# Patient Record
Sex: Female | Born: 1980 | Hispanic: Yes | Marital: Married | Smoking: Never smoker
Health system: Southern US, Community
[De-identification: ages and names within clinical notes are randomized; demographics above are authoritative.]

## PROBLEM LIST (undated history)

## (undated) DIAGNOSIS — E119 Type 2 diabetes mellitus without complications: Secondary | ICD-10-CM

---

## 2002-06-19 ENCOUNTER — Other Ambulatory Visit: Admission: RE | Admit: 2002-06-19 | Discharge: 2002-06-19 | Payer: Self-pay | Admitting: Obstetrics and Gynecology

## 2002-12-09 ENCOUNTER — Inpatient Hospital Stay (HOSPITAL_COMMUNITY): Admission: AD | Admit: 2002-12-09 | Discharge: 2002-12-15 | Payer: Self-pay | Admitting: Obstetrics and Gynecology

## 2002-12-10 ENCOUNTER — Encounter (INDEPENDENT_AMBULATORY_CARE_PROVIDER_SITE_OTHER): Payer: Self-pay | Admitting: Specialist

## 2003-01-09 ENCOUNTER — Other Ambulatory Visit: Admission: RE | Admit: 2003-01-09 | Discharge: 2003-01-09 | Payer: Self-pay | Admitting: Obstetrics and Gynecology

## 2004-02-07 ENCOUNTER — Other Ambulatory Visit: Admission: RE | Admit: 2004-02-07 | Discharge: 2004-02-07 | Payer: Self-pay | Admitting: Obstetrics and Gynecology

## 2004-08-23 ENCOUNTER — Inpatient Hospital Stay (HOSPITAL_COMMUNITY): Admission: AD | Admit: 2004-08-23 | Discharge: 2004-08-23 | Payer: Self-pay | Admitting: *Deleted

## 2008-01-24 ENCOUNTER — Ambulatory Visit: Payer: Self-pay | Admitting: Family Medicine

## 2008-01-24 ENCOUNTER — Encounter: Payer: Self-pay | Admitting: Family Medicine

## 2008-01-24 ENCOUNTER — Inpatient Hospital Stay (HOSPITAL_COMMUNITY): Admission: AD | Admit: 2008-01-24 | Discharge: 2008-01-26 | Payer: Self-pay | Admitting: Obstetrics

## 2008-08-29 ENCOUNTER — Inpatient Hospital Stay (HOSPITAL_COMMUNITY): Admission: RE | Admit: 2008-08-29 | Discharge: 2008-08-29 | Payer: Self-pay | Admitting: Obstetrics and Gynecology

## 2010-11-11 NOTE — Op Note (Signed)
NAME:  Catherine Villa, Catherine Villa                ACCOUNT NO.:  [id number]   MEDICAL RECORD NO.:  [id number]          PATIENT TYPE:  INP   LOCATION:  9106                          FACILITY:  [facility]   PHYSICIAN:  Tanya S. Shawnie Pons, M.D.   DATE OF BIRTH:  [birth year]   DATE OF PROCEDURE:  01/24/2008  DATE OF DISCHARGE:                               OPERATIVE REPORT   PREOPERATIVE DIAGNOSES:  1. Intrauterine pregnancy at 39-6/7th weeks.  2. Premature rupture of membranes.  3. Previous cesarean section.   POSTOPERATIVE DIAGNOSES:  1. Intrauterine pregnancy at 39-6/7th weeks.  2. Premature rupture of membranes.  3. Previous cesarean section.   PROCEDURE:  Repeat low transverse cesarean section.   SURGEON:  Shelbie Proctor. Shawnie Pons, MD   ASSISTANT:  Karlton Lemon, MD   ANESTHESIA:  Spinal and local with Dr. Arby Barrette.   FINDINGS:  Viable female infant, Apgars 9 and 9, and weight 8 pounds 5  ounces.   SPECIMEN:  Placenta to pathology.   ESTIMATED BLOOD LOSS:  1500 mL.   COMPLICATIONS:  None immediately known.   REASON FOR PROCEDURE:  Briefly, the patient is a 30 year old gravida 2,  para 1 who presents at 39-6/7th weeks with premature rupture of  membranes at [facility] this morning.  The patient has a previous C-  section and desires a repeat.   PROCEDURE:  The patient was taken to the OR.  She was placed in the  supine position on left lateral tilt.  She was prepped and draped in the  usual sterile fashion.  A Foley catheter was placed at the bladder.  The  knife was used to make a Pfannenstiel incision through an old scar.  This was carried down to the underlying fascia.  The patient reported  pain that was 7/10, which was very incongruent with the look on her  face.  However, the procedure was held.  Marcaine 30 mL of 0.25% were  injected in the incision and laterally in the fascia where the nerve  comes through, however, the patient continued to complain of pain.  After extensive discussion with  her and giving her nitrous, she reports  she was not feeling any pain, chest pressure, and the patient was not  tachycardic or exhibiting other signs of exquisite pain as would be  thought that she would feel with a skin incision of the caliber that was  made.  Once the patient was comfortable, the surgery continued and the  fascial incision was extended laterally with Mayo scissors.  Curved  clamps were then used to elevate the fascia off the underlying rectus  which was dissected off bluntly laterally and sharply in the midline.  The posterior sheath and pyramidalis muscles were divided in the midline  superiorly and the peritoneal cavity entered bluntly.  Once peritoneal  cavity was entered, an Teacher, early years/pre was placed inside the incision.  The patient had some pain with strain on the peritoneal cavity as well.  A knife was then used to make a low transverse incision on the uterus.  This was carried down to the amniotic cavity  which was entered bluntly.  Clear fluid was noted.  The uterine incision was extended with the  bandage scissors.  The infant was in a vertex position.  It was brought  up and delivered without difficulty.  Cord was clamped x2.  Spontaneous  crying was heard.  Infant was bulb suctioned on the abdomen and given to  awaiting peds.  Cord blood was obtained.  Placenta was delivered from  the uterus without difficulty.  Edges of the uterine incision were then  grasped with ring forceps.  The uterine incision closed with 0 Vicryl  suture in a locked running fashion.  There was some uterine atony noted  as well as some large blood vessels at the edges of the incision that  were bleeding fairly heavily.  Following initial first layer of closure,  there was some bleeding noted at both corners of the incision and in the  midline and several figure-of-eights were used with 0 Vicryl suture to  achieve hemostasis.  After hemostasis was achieved, Alexis retractor was   removed from the abdomen and the peritoneum and bowel were placed behind  the uterus.  The incision was looked out again and was felt to be  hemostatic.  The fascia was closed with 0 Vicryl suture in a running  fashion.  Subcutaneous tissue was irrigated.  Any bleeders were  cauterized.  Skin closed using clips.  Pressure dressing was applied.  All instrument, needle, and lap counts were correct x2.  The patient was  awake and taken to recovery room in stable condition.      Shelbie Proctor. Shawnie Pons, M.D.  Electronically Signed     TSP/MEDQ  D:  01/24/2008  T:  01/25/2008  Job:  [id number]

## 2010-11-11 NOTE — Discharge Summary (Signed)
NAME:  Catherine Villa, Catherine Villa                ACCOUNT NO.:  [id number]   MEDICAL RECORD NO.:  [id number]          PATIENT TYPE:  INP   LOCATION:  9106                          FACILITY:  [facility]   PHYSICIAN:  Tanya S. Shawnie Pons, M.D.   DATE OF BIRTH:  02-25-1981   DATE OF ADMISSION:  01/24/2008  DATE OF DISCHARGE:  01/26/2008                               DISCHARGE SUMMARY   DISCHARGE DIAGNOSES:  1. Status post repeat low-transverse cesarean section.  2. Intrauterine pregnancy at 39.6 weeks.   PROCEDURE:  Repeat cesarean section.   HOSPITAL COURSE:  The patient is a 30 year old G2, P1-0-0-1 at 24. 6  weeks who presented with premature rupture of membranes at scheduled  prenatal visit at [facility].  The patient was directly admitted  and desired repeat C-section.  The patient tolerated the surgery well  and delivered a viable female with Apgars of 9 at one minute and 9 at  five minutes, 8 pounds and 5 ounces.  Estimated blood loss was 1500 ml.  Three-vessel cord placenta.  Postoperatively, the patient had a  hemoglobin of 7.6 and 23.2 hematocrit.  The patient remained  asymptomatic.  Orthostatic blood pressures were within normal range.  The patient was breast feeding and decided on Micronor with bridge to  Mirena IUD at 6 weeks postpartum visit. Patient was stable upon  discharge.   ADMISSION LABS:  O positive.  HIV negative.  RPR nonreactive.  Rubella  immune.  GC and Chlamydia negative.  GBS negative.  Hemoglobin and  hematocrit on January 24, 2008, 11.4 and 35.0.  Hemoglobin and hematocrit on January 26, 2008, at discharge was 7.9 and  23.9 respectively.   DISCHARGE MEDICATIONS:  1. Percocet 5/325 mg one tab p.o. q.4-6 h. p.r.n. pain.  2. Ibuprofen 600 mg one tab p.o. q.6 h. p.r.n. pain.  3. Colace 100 mg p.o. b.i.d. p.r.n. constipation.  4. Iron sulfate 325 mg one tab p.o. b.i.d.  5. Prenatal vitamins daily.  6. Micronor 1 tab p.o. daily.   The patient is to have pelvic rest x6 weeks.  No  heavy lifting.  Return  for evaluation if heavy bleeding, headache, or dizziness occurs.   FOLLOWUP:  The patient is to follow up at the [facility] in 6 weeks.  Baby love to discontinue staples on  postop day 5 or 6.      Milinda Antis, MD  Electronically Signed     ______________________________  Shelbie Proctor. Shawnie Pons, M.D.    KD/MEDQ  D:  01/26/2008  T:  01/27/2008  Job:  [id number]

## 2010-11-14 NOTE — Discharge Summary (Signed)
NAMEMARIABELEN, PRESSLY                   ACCOUNT NO.:  [id number]   MEDICAL RECORD NO.:  [id number]                   PATIENT TYPE:  INP   LOCATION:  9144                                 FACILITY:  [facility]   PHYSICIAN:  Lorne Skeens, D.O.                   DATE OF BIRTH:  [birth year]   DATE OF ADMISSION:  12/09/2002  DATE OF DISCHARGE:  12/15/2002                                 DISCHARGE SUMMARY   DISCHARGE DIAGNOSES:  1. Postoperative day number five status post low-transverse cesarean section     for cephalopelvic disproportion at 39 weeks and 5 days.  2. Pre-eclampsia.  3. Hypertension.   DISCHARGE MEDICATIONS:  1. Percocet 5/325 one tablet p.o. q.4h. p.r.n. pain.  2. Ibuprofen 600 mg one tablet p.o. q.6h. p.r.n. cramping.  3. Ortho-Tri-Cyclen one tablet p.o. daily to start on December 24, 2002.  4. Prenatal vitamins one tablet daily x 6 weeks or while breast feeding.  5. HCTZ 25 mg one tablet p.o. daily.   HOSPITAL COURSE:  A 30 year old now G1, P1-0-0-1 presented at 39 weeks and 4  days to the maternal admissions for spontaneous rupture of membranes.  She  was unaware of her contractions occurring every 2-3 minutes.  On digital  cervical exam she was fingertip, external os closed, internal os 2 cm long,  -2 station and vertex position.  She was confirmed spontaneous rupture of  membranes with positive ferning and positive nitrazine test.  Her OB panel  was unremarkable with O positive blood type, antibody negative, rubella  immune, GBS negative, RPR nonreactive.  Blood pressures were mildly elevated  up to 142/94.   PHYSICAL EXAMINATION:  GENERAL:  She was in no acute distress.  HEART:  Unremarkable.  LUNGS:  Unremarkable.  ABDOMEN:  Soft, nontender.  Gravid.  No fundal tenderness.  EXTREMITIES:  She had trace pedal edema.  DTRs were 2-3+ with no clonus.   The patient was admitted for spontaneous rupture of membranes and rule out  PIH.  PIH labs were performed and a cath  UA was done for protein.  The  patient was encouraged to labor, given Pitocin and started to have a low-  grade temperature while in labor.  She was then started on Unison 3 grams IV  q.6h. for prolonged rupture of membranes.  After pushing for more than two  hours with increased molding.  She was still febrile and she was brought to  C-section for cephalopelvic disproportion.  She gave birth to an 8 pound 4  ounce term female with Apgar's 8 and 9 and a cord pH of 7.31.  C-section  performed by Dr. Okey Dupre.  Estimated blood loss 700 cc. She received a total of  two days of Unison postpartum.  While on magnesium sulfate she remained in  the adult intensive care unit.  She diuresed well but still continued to  have some edema.  Her blood pressures did improve  but she needed the help of  HCTZ increased to 25 mg p.o. daily.  She denied any symptoms of headache,  visual changes, epigastric or right upper quadrant pain.  Her postpartum  hemoglobin was 8.4 and she will be started on iron sulfate 325 mg b.i.d. x 6  weeks.  She did receive mag sulfate until 1700 on June 16.  Her PIH labs  trended down.  On discharge day she has 2+ nonpitting edema, no clonus, no  epigastric or right upper quadrant pain and is currently stable.  She is  currently  bottle feeding.  She will be started on Ortho-Tri-Cyclen the second Sunday  after delivery.  Staples have been removed post-op day #5.  She will be sent  home with pre-eclampsia instructions and followup appointment in six weeks  at [facility].                                                Lorne Skeens, D.O.    KL/MEDQ  D:  12/15/2002  T:  12/16/2002  Job:  [id number]

## 2010-11-14 NOTE — Op Note (Signed)
NAMESHONICA, Catherine Villa                   ACCOUNT NO.:  [id number]   MEDICAL RECORD NO.:  [id number]                   PATIENT TYPE:  INP   LOCATION:  9160                                 FACILITY:  [facility]   PHYSICIAN:  Phil D. Okey Dupre, M.D.                  DATE OF BIRTH:  30-Sep-1980   DATE OF PROCEDURE:  12/10/2002  DATE OF DISCHARGE:                                 OPERATIVE REPORT   PREOPERATIVE DIAGNOSIS:  Cephalopelvic disproportion.   POSTOPERATIVE DIAGNOSIS:  Cephalopelvic disproportion.   PROCEDURE:  Low transverse cesarean section.   SURGEON:  Javier Glazier. Okey Dupre, M.D.   FIRST ASSISTANT:  Tamika J. Lazarus Salines, M.D.   ANESTHESIA:  Epidural.   ESTIMATED BLOOD LOSS:  700 mL.   OPERATIVE FINDINGS:  On entering the uterus, the vertex was in the direct  posterior presentation with 4+ caput and molding.   PROCEDURE:  Under satisfactory epidural anesthesia with the patient in the  dorsal supine position and a Foley catheter in the urinary bladder, the  abdomen was prepped and draped in the usual sterile manner and entered  through a transverse suprapubic incision situated 3 cm above the symphysis  pubis and extended for a total length of 18 cm.  The abdomen was entered by  layers.  On entering the peritoneal cavity, the visceral peritoneum of the  anterior surface of the uterus opened transversely by sharp dissection, the  bladder pushed away from the lower uterine segment, which was entered by  sharp and blunt dissection  and from a direct posterior presentation, the  vertex was easily delivered, the cord doubly clamped and divided, and the  baby handed to the pediatrician.  Cord blood taken for analysis.  The  placenta was manually removed and cultured for aerobic and anaerobic  bacteria.  The uterus was explored and closed with continuous running locked  0 Vicryl on an atraumatic needle.  The area was observed for bleeding and  none was noted, and the fascia was closed from either  end of the incision  meeting in the midpoint in continuous alternating locked 0 Vicryl suture on  an atraumatic needle.  Subcutaneous bleeders were controlled with hot  cautery.  The skin was approximated with skin staples.  A dry sterile  dressing was applied.  The patient was transferred to the recovery room in  satisfactory condition.  During the entire procedure and prior to the  procedure, no urine was noted in the catheter, so the patient will be given  a bolus in the recovery room.  The instrument, sponge, and needle count were  reported as correct at the end of the procedure.                                               Phil D. Okey Dupre,  M.D.    PDR/MEDQ  D:  12/10/2002  T:  12/11/2002  Job:  [id number]

## 2011-01-05 ENCOUNTER — Telehealth (HOSPITAL_COMMUNITY): Payer: Self-pay | Admitting: Lactation Services

## 2011-01-05 NOTE — Telephone Encounter (Signed)
Pt is selected in error.

## 2011-03-27 LAB — CBC
HCT: 23.9 — ABNORMAL LOW
HCT: 35 — ABNORMAL LOW
Hemoglobin: 11.4 — ABNORMAL LOW
Hemoglobin: 7.9 — CL
MCHC: 32.8
MCHC: 33.2
MCV: 78.4
MCV: 78.6
Platelets: 204
RBC: 2.96 — ABNORMAL LOW
RBC: 3.05 — ABNORMAL LOW
RBC: 4.44
RDW: 15.8 — ABNORMAL HIGH
WBC: 14 — ABNORMAL HIGH

## 2014-02-08 LAB — OB RESULTS CONSOLE ABO/RH: RH TYPE: POSITIVE

## 2014-02-08 LAB — OB RESULTS CONSOLE GC/CHLAMYDIA
CHLAMYDIA, DNA PROBE: NEGATIVE
Gonorrhea: NEGATIVE

## 2014-02-08 LAB — OB RESULTS CONSOLE PLATELET COUNT: PLATELETS: 280 10*3/uL

## 2014-02-08 LAB — GLUCOSE TOLERANCE, 1 HOUR: Glucose, 1 Hour GTT: 161

## 2014-02-08 LAB — OB RESULTS CONSOLE HGB/HCT, BLOOD
HCT: 40 %
HEMOGLOBIN: 12.8 g/dL

## 2014-02-08 LAB — CULTURE, OB URINE: Urine Culture, OB: NEGATIVE

## 2014-02-08 LAB — OB RESULTS CONSOLE RPR: RPR: NONREACTIVE

## 2014-02-08 LAB — CYTOLOGY - PAP: PAP SMEAR: NEGATIVE

## 2014-02-08 LAB — OB RESULTS CONSOLE VARICELLA ZOSTER ANTIBODY, IGG: Varicella: IMMUNE

## 2014-02-08 LAB — OB RESULTS CONSOLE ANTIBODY SCREEN: Antibody Screen: NEGATIVE

## 2014-02-09 LAB — SICKLE CELL SCREEN: Sickle Cell Screen: NORMAL

## 2014-02-09 LAB — OB RESULTS CONSOLE HIV ANTIBODY (ROUTINE TESTING): HIV: NONREACTIVE

## 2014-02-09 LAB — OB RESULTS CONSOLE HEPATITIS B SURFACE ANTIGEN: Hepatitis B Surface Ag: NEGATIVE

## 2014-02-09 LAB — OB RESULTS CONSOLE RUBELLA ANTIBODY, IGM: RUBELLA: IMMUNE

## 2014-02-12 LAB — GLUCOSE TOLERANCE, 3 HOURS
Glucose, GTT - 1 Hour: 110 mg/dL (ref ?–200)
Glucose, GTT - 2 Hour: 167 mg/dL — AB (ref ?–140)
Glucose, GTT - 3 Hour: 146 mg/dL — AB (ref ?–140)
Glucose, GTT - Fasting: 95 mg/dL (ref 80–110)

## 2014-02-22 ENCOUNTER — Encounter: Payer: Self-pay | Admitting: General Practice

## 2014-02-26 ENCOUNTER — Encounter: Payer: Self-pay | Attending: Obstetrics & Gynecology | Admitting: *Deleted

## 2014-02-26 ENCOUNTER — Encounter: Payer: Self-pay | Admitting: Obstetrics & Gynecology

## 2014-02-26 ENCOUNTER — Ambulatory Visit (INDEPENDENT_AMBULATORY_CARE_PROVIDER_SITE_OTHER): Payer: Self-pay | Admitting: Obstetrics & Gynecology

## 2014-02-26 VITALS — BP 100/55 | HR 68 | Temp 97.9°F | Wt 129.5 lb

## 2014-02-26 DIAGNOSIS — O24919 Unspecified diabetes mellitus in pregnancy, unspecified trimester: Secondary | ICD-10-CM

## 2014-02-26 DIAGNOSIS — O24319 Unspecified pre-existing diabetes mellitus in pregnancy, unspecified trimester: Secondary | ICD-10-CM | POA: Insufficient documentation

## 2014-02-26 DIAGNOSIS — E119 Type 2 diabetes mellitus without complications: Secondary | ICD-10-CM | POA: Insufficient documentation

## 2014-02-26 DIAGNOSIS — Z713 Dietary counseling and surveillance: Secondary | ICD-10-CM | POA: Insufficient documentation

## 2014-02-26 DIAGNOSIS — O24312 Unspecified pre-existing diabetes mellitus in pregnancy, second trimester: Secondary | ICD-10-CM

## 2014-02-26 DIAGNOSIS — O34219 Maternal care for unspecified type scar from previous cesarean delivery: Secondary | ICD-10-CM | POA: Insufficient documentation

## 2014-02-26 LAB — POCT URINALYSIS DIP (DEVICE)
Bilirubin Urine: NEGATIVE
Glucose, UA: NEGATIVE mg/dL
Ketones, ur: NEGATIVE mg/dL
Leukocytes, UA: NEGATIVE
Nitrite: NEGATIVE
Protein, ur: NEGATIVE mg/dL
Specific Gravity, Urine: 1.015 (ref 1.005–1.030)
Urobilinogen, UA: 1 mg/dL (ref 0.0–1.0)
pH: 7.5 (ref 5.0–8.0)

## 2014-02-26 LAB — COMPREHENSIVE METABOLIC PANEL
ALBUMIN: 4 g/dL (ref 3.5–5.2)
AST: 12 U/L (ref 0–37)
Alkaline Phosphatase: 62 U/L (ref 39–117)
BUN: 7 mg/dL (ref 6–23)
CALCIUM: 9.1 mg/dL (ref 8.4–10.5)
CO2: 22 meq/L (ref 19–32)
Chloride: 104 mEq/L (ref 96–112)
Creat: 0.47 mg/dL — ABNORMAL LOW (ref 0.50–1.10)
Glucose, Bld: 79 mg/dL (ref 70–99)
POTASSIUM: 3.8 meq/L (ref 3.5–5.3)
Sodium: 136 mEq/L (ref 135–145)
Total Bilirubin: 0.5 mg/dL (ref 0.2–1.2)
Total Protein: 6.9 g/dL (ref 6.0–8.3)

## 2014-02-26 LAB — TSH: TSH: 0.514 u[IU]/mL (ref 0.350–4.500)

## 2014-02-26 LAB — CBC
HCT: 35 % — ABNORMAL LOW (ref 36.0–46.0)
Hemoglobin: 12.2 g/dL (ref 12.0–15.0)
MCH: 27.9 pg (ref 26.0–34.0)
MCHC: 34.9 g/dL (ref 30.0–36.0)
MCV: 80.1 fL (ref 78.0–100.0)
PLATELETS: 312 10*3/uL (ref 150–400)
RBC: 4.37 MIL/uL (ref 3.87–5.11)
RDW: 14.4 % (ref 11.5–15.5)
WBC: 11.1 10*3/uL — ABNORMAL HIGH (ref 4.0–10.5)

## 2014-02-26 MED ORDER — ASPIRIN EC 81 MG PO TBEC: 81.0000 mg | DELAYED_RELEASE_TABLET | Freq: Every day | ORAL | Status: DC

## 2014-02-26 NOTE — Progress Notes (Signed)
Nutrition note: 1st visit consult & GDM diet education Pt is a newly diagnosed GDM pt. Pt has gained 9.5# @ [redacted]w[redacted]d, which is > expected. Pt reports eating 3 meals & 3-4 snacks/d. Pt is taking a PNV. Pt reports no N/V or heartburn. Pt reports walking occ. Pt received verbal & written education on GDM diet in Spanish via interpreter. Discussed importance of BF. Discussed wt gain goals of 25-35# or 1#/wk. Pt agrees to follow GDM diet with 3 meals & 3 snacks/d with proper CHO/ protein combination.  Pt has WIC & plans to BF. F/u in 4-6 wks Blondell Reveal, MS, RD, LDN, [facility]

## 2014-02-26 NOTE — Progress Notes (Signed)
Patient is Spanish-speaking only, Spanish interpreter present for this encounter.  Baseline labs  Baby ASA prescribed  Fetal ECHO - scheduled  Optho exam - scheduled  Serial growth scans [id number]  Antenatal testing starting at 32 weeks  Delivery by 39 weeks or earlier if needed Anatomy scan ordered. Quad screen ordered. Discussed implications of DM in pregnancy, need for optimizing glycemic control to decrease DM associated maternal-fetal morbidity and mortality, need for antenatal testing and frequent ultrasounds/prenatal visits. Will get DM education and supplies today.  Reevaluate in two weeks. Routine obstetric precautions reviewed.

## 2014-02-26 NOTE — Patient Instructions (Signed)
Diabetes mellitus tipo 1 o tipo 2 durante el embarazo (Type 1 or Type 2 Diabetes Mellitus During Pregnancy) La diabetes mellitus, generalmente denominada diabetes, es una enfermedad prolongada (crnica). La diabetes tipo1 ocurre cuando las clulas de los islotes, que estn en el pncreas y producen la hormona insulina, se destruyen y ya no pueden producir insulina. La diabetes tipo2 ocurre cuando el pncreas no produce suficiente insulina, las clulas son menos sensibles a la insulina que se produce (resistencia a la insulina), o ambos. La insulina es necesaria para movilizar los azcares de los alimentos a las clulas de los tejidos. Las clulas de los tejidos utilizan los azcares para obtener energa. La falta de insulina o la falta de una respuesta normal a la insulina hace que el exceso de azcar se acumule en la sangre en lugar de penetrar en las clulas de los tejidos. Como resultado, se producen niveles altos de azcar en la sangre (hiperglucemia).  Si se mantienen los niveles de glucosa en la sangre en un rango normal antes y durante el embarazo, las mujeres pueden tener un embarazo saludable. Si no se controlan los niveles de glucosa en la sangre adecuadamente, puede haber riesgos para usted, para el beb que no ha nacido y durante el trabajo de parto y el parto. Tambin puede haber riesgos para el beb cuando nazca.  FACTORES DE RIESGO   Una persona est predispuesta a desarrollar diabetes tipo 1 si alguien en su familia padece la enfermedad y se expone a ciertos desencadenantes ambientales adicionales.  Tiene una mayor probabilidad de desarrollar diabetes tipo 2 si tiene antecedentes familiares de diabetes y tambin tiene uno o ms de los siguientes factores de riesgo:  Tener sobrepeso.  Tiene un estilo de vida sedentario.  Ha consumido, a lo largo de toda su vida, una gran cantidad de alimentos con muchas caloras. SNTOMAS  Aumento de la sed (polidipsia).  Aumento de la miccin  (poliuria).  Orina con ms frecuencia durante la noche (nocturia).  Prdida de peso. La prdida de peso puede ser muy rpida.  Infecciones frecuentes y recurrentes.  Cansancio (fatiga).  Debilidad.  Cambios en la visin, como visin borrosa.  Olor a fruta en el aliento.  Dolor abdominal.  Nuseas o vmitos. DIAGNSTICO  La diabetes se diagnostica cuando hay aumento de los niveles de glucosa en la sangre. El nivel de glucosa en la sangre puede controlarse en uno o ms de los siguientes anlisis de sangre:  Medicin de glucosa en la sangre en ayunas. No se le permitir comer durante al menos 8 horas antes de que se tome una muestra de sangre.  Pruebas al azar de glucosa en la sangre. El nivel de glucosa en la sangre se controla en cualquier momento del da sin importar el momento en que haya comido.  Prueba de A1c (hemoglobina glucosilada) Una prueba de A1c proporciona informacin sobre el control de la glucosa en la sangre durante los ltimos 3 meses.  Prueba de tolerancia a la glucosa oral (PTGO). La glucosa en la sangre se mide despus de no haber comido (ayunas) durante una a tres horas y despus de beber una bebida que contenga glucosa. La prueba de tolerancia a la glucosa oral se realiza durante las semanas 24 a 28 del embarazo. TRATAMIENTO   Usted tendr que tomar medicamentos para la diabetes o insulina diariamente para mantener los niveles de glucosa en la sangre en el rango deseado.  Usted tendr que combinar la dosis de insulina con la actividad fsica y   la eleccin de alimentos saludables. El objetivo del tratamiento es mantener el nivel de azcar en la sangre previo a comer (preprandial) y durante la noche entre 60 y 99mg/dl, durante todo el embarazo. El objetivo del tratamiento es mantener el mayor nivel de azcar en la sangre despus de comer (glucosa pospandial) entre 100 y 140mg/dl.  INSTRUCCIONES PARA EL CUIDADO EN EL HOGAR   Controle su nivel de hemoglobina A1c  dos veces al ao.  Contrlese a diario el nivel de glucosa en la sangre segn las indicaciones de su mdico. Es comn realizar controles frecuentes de la glucosa en la sangre.  Supervise las cetonas en la orina cuando est enfermo y segn las indicaciones de su mdico.  Tome el medicamento para la diabetes y adminstrese insulina segn las indicaciones de su mdico para mantener el nivel de glucosa en la sangre en el rango deseado.  Nunca se quede sin medicamento para la diabetes o sin insulina. Es necesario que la reciba todos los das.  Ajuste la insulina segn la ingesta de hidratos de carbono. Los hidratos de carbono pueden aumentar los niveles de glucosa en la sangre, pero deben incluirse en su dieta. Aportan vitaminas, minerales y fibra que son una parte esencial de una dieta saludable. Los hidratos de carbono se encuentran en frutas, verduras, cereales integrales, productos lcteos, legumbres y alimentos que contienen azcares aadidos.  Consuma alimentos saludables. Alterne 3 comidas con 3 colaciones.  Aumente de peso saludablemente. El aumento del peso total vara de acuerdo con el ndice de masa corporal que tena antes del embarazo ([facility]).  Lleve una tarjeta de alerta mdica o use un brazalete o medalla de alerta mdica.  Lleve con usted una colacin de 15gramos de carbohidratos en todo momento para controlar los niveles bajos de glucosa en la sangre (hipoglucemia). Algunos ejemplos de colaciones de 15gramos de hidratos de carbono son los siguientes:  Tabletas de glucosa, 3 o 4.  Gel de glucosa, tubo de 15 gramos.  Pasas de uva, 2cucharadas (24gramos).  Caramelos de goma, 6.  Galletas de animales, 8.  Jugo de fruta, gaseosa comn, o leche descremada, 4 onzas (120 ml).  Pastillas de goma, 9.  Reconocer la hipoglucemia. Durante el embarazo la hipoglucemia se produce cuando hay niveles de glucosa en la sangre de 60 mg/dl o menos. El riesgo de hipoglucemia aumenta durante  el ayuno o cuando se saltea las comidas, durante o despus de realizar ejercicio intenso y mientras duerme. Los sntomas de hipoglucemia son:  Temblores o sacudidas.  Disminucin de la capacidad de concentracin.  Sudoracin.  Aumento de la frecuencia cardaca.  Dolor de cabeza.  Sequedad en la boca.  Hambre.  Irritabilidad.  Ansiedad.  Sueo agitado.  Alteracin del habla o de la coordinacin.  Confusin.  Tratar la hipoglucemia rpidamente. Si usted est alerta y puede tragar con seguridad, siga la regla de 15/15 que consiste en:  Tome entre 15 y 20gramos de glucosa de accin rpida o carbohidratos. Las opciones de accin rpida son un gel de glucosa, tabletas de glucosa, o 4 onzas (120 ml) de jugo de frutas, gaseosa comn, o leche baja en grasa.  Compruebe su nivel de glucosa en la sangre 15 minutos despus de tomar la glucosa.  Tome entre 15 y 20gramos ms de glucosa si el nivel de glucosa en la sangre todava es de 70mg/dl o inferior.  Ingiera una comida o una colacin en el lapso de 1 hora una vez que los niveles de glucosa en la sangre   vuelven a la normalidad.  Haga actividad fsica por lo menos 30minutos al da o como lo indique su mdico. Se recomienda que 30 minutos despus de cada comida, realice diez minutos de actividad fsica para controlar los niveles de glucosa postprandial en la sangre.  Est alerta a la poliuria (miccin excesiva) y la polidipsia (sensacin de mucha sed), que son los primeros signos de la hiperglucemia. El reconocimiento temprano de la hiperglucemia permite un tratamiento oportuno. Trate la hiperglucemia segn le indic su mdico.  Ajuste su dosis de insulina y la ingesta de alimentos, segn sea necesario, si inicia un nuevo ejercicio o deporte.  Siga su plan para los das de enfermedad cuando no puede comer o beber como de costumbre.  Evite el tabaco y el alcohol.  Concurra a todas las visitas de control como se lo haya indicado el  mdico.  Siga el consejo del mdico respecto a los controles prenatales y posteriores al parto (postparto), las visitas, la planificacin de las comidas, el ejercicio, los medicamentos, las vitaminas, los anlisis de sangre, otras pruebas mdicas y actividades fsicas.  Cuide diariamente la piel y los pies. Examine su piel y los pies diariamente para ver si tiene cortes, moretones, enrojecimiento, problemas en las uas, sangrado, ampollas o llagas. Su mdico debe hacerle un examen de los pies una vez por ao.  Cepllese los dientes y encas por lo menos dos veces al da y use hilo dental al menos una vez por da. Concurra regularmente a las visitas de control con el dentista.  Programe un examen de vista durante el primer trimestre de su embarazo o como lo indique su mdico.  Comparta su plan de control de diabetes en el trabajo o en la escuela.  Mantngase al da con las vacunas.  Aprenda a manejar el estrs.  Obtenga la mayor cantidad posible de informacin sobre la diabetes y solicite ayuda siempre que sea necesario.  Su mdico puede recomendarle que tome una aspirina de dosis baja (81mg) cada da a fin de ayudar a prevenir la hipertensin durante el embarazo (preeclampsia o eclampsia). Puede estar en riesgo de padecer preeclampsia o eclampsia si:  Padeci preeclampsia o eclampsia durante un embarazo anterior.  Su beb no creci segn lo previsto durante un embarazo anterior.  Tuvo un parto prematuro en un embarazo anterior.  Experiment una separacin de la placenta desde el tero (desprendimiento abrupto de la placenta) durante un embarazo anterior.  Perdi un beb en un embarazo anterior.  Est embarazada de ms de un beb.  Padece otras afecciones mdicas, como hipertensin arterial o una enfermedad autoinmunitaria. SOLICITE ATENCIN MDICA SI:   No puede comer alimentos o beber por ms de 6 horas.  Tuvo nuseas o ha vomitado durante ms de 6 horas.  Tiene un nivel de  glucosa en la sangre de 200 mg/dl y cetonas en la orina.  Presenta algn cambio en el estado mental.  Desarrolla problemas de visin.  Sufre un dolor persistente de cabeza.  Siente dolor o molestias en la parte superior del abdomen.  Tiene una enfermedad grave adicional.  Tuvo diarrea durante ms de 6 horas.  Ha estado enfermo o ha tenido fiebre durante 2 das y no mejora. SOLICITE ATENCIN MDICA DE INMEDIATO SI:  Tiene dificultad para respirar.  Ya no siente los movimientos del beb.  Est sangrando o tiene flujo vaginal.  Comienza a tener contracciones o trabajo de parto prematuro. ASEGRESE DE QUE:  Comprende estas instrucciones.  Controlar su afeccin.  Recibir ayuda de   inmediato si no mejora o si empeora. Document Released: 03/09/2012 Document Revised: 10/30/2013 [employer] Patient Information 2015 [employer]. This information is not intended to replace advice given to you by your health care provider. Make sure you discuss any questions you have with your health care provider.  

## 2014-02-26 NOTE — Progress Notes (Signed)
  Patient was seen on 02/26/14 for Gestational Diabetes self-management . Her primary language is spanish, interpreter present. The following learning objectives were met by the patient :   States the definition of Gestational Diabetes  States why dietary management is important in controlling blood glucose  States when to check blood glucose levels  Demonstrates proper blood glucose monitoring techniques  States the effect of stress and exercise on blood glucose levels  Plan:  Consider  increasing your activity level by walking daily as tolerated Begin checking BG before breakfast and 2 hours after first bit of breakfast, lunch and dinner after  as directed by MD   Blood glucose monitor given: [employer] Lot # [id number] Exp: 2016/03/18 Blood glucose reading: 95  Patient instructed to monitor glucose levels: FBS: 60 - <90 2 hour: <120  Patient received the following handouts:  Nutrition Diabetes and Pregnancy  Patient will be seen for follow-up as needed.

## 2014-02-27 LAB — PROTEIN / CREATININE RATIO, URINE
CREATININE, URINE: 186.4 mg/dL
Protein Creatinine Ratio: 0.08 (ref <0.15)
TOTAL PROTEIN, URINE: 14 mg/dL (ref 5–24)

## 2014-02-27 LAB — HEMOGLOBIN A1C
HEMOGLOBIN A1C: 5.6 % (ref <5.7)
Mean Plasma Glucose: 114 mg/dL (ref <117)

## 2014-02-28 LAB — AFP, QUAD SCREEN
AFP: 6.1 ng/mL
HCG, Total: 102.71 IU/mL
INH: 509.4 pg/mL
uE3 Value: 0.17 ng/mL

## 2014-02-28 LAB — PRESCRIPTION MONITORING PROFILE (19 PANEL)
Amphetamine/Meth: NEGATIVE ng/mL
BARBITURATE SCREEN, URINE: NEGATIVE ng/mL
Benzodiazepine Screen, Urine: NEGATIVE ng/mL
Buprenorphine, Urine: NEGATIVE ng/mL
CREATININE, URINE: 183.24 mg/dL (ref >20.0)
Cannabinoid Scrn, Ur: NEGATIVE ng/mL
Carisoprodol, Urine: NEGATIVE ng/mL
Cocaine Metabolites: NEGATIVE ng/mL
Fentanyl, Ur: NEGATIVE ng/mL
MDMA URINE: NEGATIVE ng/mL
MEPERIDINE UR: NEGATIVE ng/mL
Methadone Screen, Urine: NEGATIVE ng/mL
Methaqualone: NEGATIVE ng/mL
NITRITES URINE, INITIAL: NEGATIVE ug/mL
OPIATE SCREEN, URINE: NEGATIVE ng/mL
OXYCODONE SCRN UR: NEGATIVE ng/mL
PH URINE, INITIAL: 7.6 pH (ref 4.5–8.9)
PROPOXYPHENE: NEGATIVE ng/mL
Phencyclidine, Ur: NEGATIVE ng/mL
Tapentadol, urine: NEGATIVE ng/mL
Tramadol Scrn, Ur: NEGATIVE ng/mL
ZOLPIDEM, URINE: NEGATIVE ng/mL

## 2014-03-06 ENCOUNTER — Ambulatory Visit (INDEPENDENT_AMBULATORY_CARE_PROVIDER_SITE_OTHER): Payer: Self-pay | Admitting: Home Health Services

## 2014-03-06 DIAGNOSIS — O24919 Unspecified diabetes mellitus in pregnancy, unspecified trimester: Secondary | ICD-10-CM

## 2014-03-06 NOTE — Progress Notes (Signed)
DIABETES Pt came in to have a retinal scan per diabetic care.   Image was taken and submitted to [facility] for reading.    Results will be available in 1-2 weeks.  Results will be given to PCP for review and to contact patient.  Jazae Gandolfi  

## 2014-03-07 ENCOUNTER — Encounter: Payer: Self-pay | Admitting: *Deleted

## 2014-03-15 ENCOUNTER — Encounter: Payer: Self-pay | Admitting: Family

## 2014-03-15 ENCOUNTER — Ambulatory Visit (INDEPENDENT_AMBULATORY_CARE_PROVIDER_SITE_OTHER): Payer: Self-pay | Admitting: Family

## 2014-03-15 VITALS — BP 111/66 | HR 77 | Temp 98.3°F | Wt 128.2 lb

## 2014-03-15 DIAGNOSIS — O24919 Unspecified diabetes mellitus in pregnancy, unspecified trimester: Secondary | ICD-10-CM

## 2014-03-15 DIAGNOSIS — O34219 Maternal care for unspecified type scar from previous cesarean delivery: Secondary | ICD-10-CM

## 2014-03-15 DIAGNOSIS — Z23 Encounter for immunization: Secondary | ICD-10-CM

## 2014-03-15 DIAGNOSIS — O24312 Unspecified pre-existing diabetes mellitus in pregnancy, second trimester: Secondary | ICD-10-CM

## 2014-03-15 DIAGNOSIS — E119 Type 2 diabetes mellitus without complications: Secondary | ICD-10-CM

## 2014-03-15 LAB — POCT URINALYSIS DIP (DEVICE)
Bilirubin Urine: NEGATIVE
Glucose, UA: NEGATIVE mg/dL
HGB URINE DIPSTICK: NEGATIVE
Ketones, ur: NEGATIVE mg/dL
LEUKOCYTES UA: NEGATIVE
NITRITE: NEGATIVE
PH: 7.5 (ref 5.0–8.0)
PROTEIN: NEGATIVE mg/dL
Specific Gravity, Urine: 1.015 (ref 1.005–1.030)
Urobilinogen, UA: 0.2 mg/dL (ref 0.0–1.0)

## 2014-03-15 NOTE — Progress Notes (Signed)
Information sheet given for Flu vaccine; Pt refused vaccine

## 2014-03-15 NOTE — Progress Notes (Signed)
FBS 78-94 (3/17) B 74-124 (3/17) L 85-170 (3/17) D 87-145 (6/17).  Declined flu.  Anatomy ultrasound scheduled for 04/02/14.  Reviewed lab results.

## 2014-04-04 ENCOUNTER — Ambulatory Visit (HOSPITAL_COMMUNITY)
Admission: RE | Admit: 2014-04-04 | Discharge: 2014-04-04 | Disposition: A | Discharge status: Home / Self Care | Payer: Medicaid Other | Source: Ambulatory Visit | Attending: Obstetrics & Gynecology | Admitting: Obstetrics & Gynecology

## 2014-04-04 DIAGNOSIS — O09522 Supervision of elderly multigravida, second trimester: Secondary | ICD-10-CM | POA: Diagnosis not present

## 2014-04-04 DIAGNOSIS — O24112 Pre-existing diabetes mellitus, type 2, in pregnancy, second trimester: Secondary | ICD-10-CM | POA: Insufficient documentation

## 2014-04-04 DIAGNOSIS — O24312 Unspecified pre-existing diabetes mellitus in pregnancy, second trimester: Secondary | ICD-10-CM

## 2014-04-04 DIAGNOSIS — E119 Type 2 diabetes mellitus without complications: Secondary | ICD-10-CM | POA: Insufficient documentation

## 2014-04-04 DIAGNOSIS — Z3A18 18 weeks gestation of pregnancy: Secondary | ICD-10-CM | POA: Diagnosis not present

## 2014-04-05 ENCOUNTER — Encounter: Payer: Self-pay | Admitting: Obstetrics & Gynecology

## 2014-04-05 ENCOUNTER — Ambulatory Visit (INDEPENDENT_AMBULATORY_CARE_PROVIDER_SITE_OTHER): Payer: Self-pay | Admitting: Family

## 2014-04-05 VITALS — BP 92/51 | HR 69 | Temp 98.8°F | Wt 126.6 lb

## 2014-04-05 DIAGNOSIS — O34219 Maternal care for unspecified type scar from previous cesarean delivery: Secondary | ICD-10-CM

## 2014-04-05 DIAGNOSIS — O24912 Unspecified diabetes mellitus in pregnancy, second trimester: Secondary | ICD-10-CM

## 2014-04-05 DIAGNOSIS — O3421 Maternal care for scar from previous cesarean delivery: Secondary | ICD-10-CM

## 2014-04-05 DIAGNOSIS — O24312 Unspecified pre-existing diabetes mellitus in pregnancy, second trimester: Secondary | ICD-10-CM

## 2014-04-05 DIAGNOSIS — O09522 Supervision of elderly multigravida, second trimester: Secondary | ICD-10-CM

## 2014-04-05 DIAGNOSIS — O09529 Supervision of elderly multigravida, unspecified trimester: Secondary | ICD-10-CM | POA: Insufficient documentation

## 2014-04-05 LAB — POCT URINALYSIS DIP (DEVICE)
Bilirubin Urine: NEGATIVE
Glucose, UA: NEGATIVE mg/dL
KETONES UR: NEGATIVE mg/dL
Leukocytes, UA: NEGATIVE
Nitrite: NEGATIVE
PH: 7 (ref 5.0–8.0)
PROTEIN: NEGATIVE mg/dL
SPECIFIC GRAVITY, URINE: 1.015 (ref 1.005–1.030)
Urobilinogen, UA: 0.2 mg/dL (ref 0.0–1.0)

## 2014-04-05 NOTE — Progress Notes (Signed)
F 81-104 (10/20)   B 80-119 L 83-133 (3/20) D 86-133 (3/20).  Eating ice cream and drinking milk on some nights before bed.  Recommended snacks before bedtime, but lean protein; gave examples.  Will try this before adding medication HS.   Reviewed ultrasound results EIF.  Recommended genetic counseling based on age and recommendation in ultrasound report.

## 2014-04-09 LAB — AFP, QUAD SCREEN
AFP: 29.1 ng/mL
Curr Gest Age: 19 wks.days
HCG, Total: 16.16 IU/mL
INH: 165.7 pg/mL
Interpretation-AFP: NEGATIVE
MOM FOR INH: 0.87
MoM for AFP: 0.54
MoM for hCG: 0.61
Open Spina bifida: NEGATIVE
Tri 18 Scr Risk Est: NEGATIVE
uE3 Mom: 0.91
uE3 Value: 1.47 ng/mL

## 2014-04-10 ENCOUNTER — Encounter: Payer: Self-pay | Admitting: Family

## 2014-04-19 ENCOUNTER — Ambulatory Visit (INDEPENDENT_AMBULATORY_CARE_PROVIDER_SITE_OTHER): Payer: Self-pay | Admitting: Family Medicine

## 2014-04-19 ENCOUNTER — Other Ambulatory Visit (HOSPITAL_COMMUNITY): Payer: Self-pay | Admitting: Maternal and Fetal Medicine

## 2014-04-19 VITALS — BP 98/50 | HR 70 | Temp 98.5°F | Wt 126.0 lb

## 2014-04-19 DIAGNOSIS — O3421 Maternal care for scar from previous cesarean delivery: Secondary | ICD-10-CM

## 2014-04-19 DIAGNOSIS — O24012 Pre-existing diabetes mellitus, type 1, in pregnancy, second trimester: Secondary | ICD-10-CM

## 2014-04-19 DIAGNOSIS — Z3689 Encounter for other specified antenatal screening: Secondary | ICD-10-CM

## 2014-04-19 DIAGNOSIS — O09522 Supervision of elderly multigravida, second trimester: Secondary | ICD-10-CM

## 2014-04-19 DIAGNOSIS — O34219 Maternal care for unspecified type scar from previous cesarean delivery: Secondary | ICD-10-CM

## 2014-04-19 LAB — POCT URINALYSIS DIP (DEVICE)
BILIRUBIN URINE: NEGATIVE
GLUCOSE, UA: NEGATIVE mg/dL
Hgb urine dipstick: NEGATIVE
Ketones, ur: 15 mg/dL — AB
LEUKOCYTES UA: NEGATIVE
Nitrite: NEGATIVE
PROTEIN: NEGATIVE mg/dL
SPECIFIC GRAVITY, URINE: 1.02 (ref 1.005–1.030)
Urobilinogen, UA: 0.2 mg/dL (ref 0.0–1.0)
pH: 7.5 (ref 5.0–8.0)

## 2014-04-19 NOTE — Progress Notes (Signed)
Catherine Villa used as interpreter for this encounter.  Declines flu vaccine.

## 2014-04-19 NOTE — Progress Notes (Signed)
Patient is 33 y.o. [id number] with A1DM [id number] .  +FM, denies LOF, VB, contractions, vaginal discharge.  Overall feeling well. Fasting: 86-90 1/6 out of range, 2h pp: 88-128 2/15 out of range - EIF referred for genetic counseling, screening was normal and reporting was told at time of fetal ECHO 10/5 that ECHO was normal, as such she would like to cancel appt

## 2014-04-30 ENCOUNTER — Encounter: Payer: Self-pay | Admitting: Obstetrics & Gynecology

## 2014-05-03 ENCOUNTER — Ambulatory Visit (HOSPITAL_COMMUNITY): Payer: Self-pay

## 2014-05-03 ENCOUNTER — Encounter (HOSPITAL_COMMUNITY): Payer: Self-pay

## 2014-05-17 ENCOUNTER — Encounter: Payer: Self-pay | Admitting: Family Medicine

## 2014-05-17 ENCOUNTER — Ambulatory Visit (INDEPENDENT_AMBULATORY_CARE_PROVIDER_SITE_OTHER): Payer: Self-pay | Admitting: Family Medicine

## 2014-05-17 DIAGNOSIS — O24912 Unspecified diabetes mellitus in pregnancy, second trimester: Secondary | ICD-10-CM

## 2014-05-17 DIAGNOSIS — O24312 Unspecified pre-existing diabetes mellitus in pregnancy, second trimester: Secondary | ICD-10-CM

## 2014-05-17 LAB — POCT URINALYSIS DIP (DEVICE)
BILIRUBIN URINE: NEGATIVE
GLUCOSE, UA: NEGATIVE mg/dL
Hgb urine dipstick: NEGATIVE
KETONES UR: NEGATIVE mg/dL
Leukocytes, UA: NEGATIVE
Nitrite: NEGATIVE
Protein, ur: NEGATIVE mg/dL
SPECIFIC GRAVITY, URINE: 1.015 (ref 1.005–1.030)
UROBILINOGEN UA: 0.2 mg/dL (ref 0.0–1.0)
pH: 5.5 (ref 5.0–8.0)

## 2014-05-17 NOTE — Patient Instructions (Signed)
Diabetes mellitus gestacional (Gestational Diabetes Mellitus) La diabetes mellitus gestacional, ms comnmente conocida como diabetes gestacional es un tipo de diabetes que desarrollan algunas mujeres durante el embarazo. En la diabetes gestacional, el pncreas no produce suficiente insulina (una hormona) o las clulas son menos sensibles a la insulina producida (resistencia a la insulina), o ambas cosas. Normalmente, la insulina mueve los azcares de los alimentos a las clulas de los tejidos. Las clulas de los tejidos utilizan los azcares para obtener energa. La falta de insulina o la falta de una respuesta normal a la insulina hace que el exceso de azcar se acumule en la sangre en lugar de penetrar en las clulas de los tejidos. Como resultado, se producen niveles altos de azcar en la sangre (hiperglucemia). El efecto de los niveles altos de azcar (glucosa) puede causar muchos problemas.  FACTORES DE RIESGO Usted tiene mayor probabilidad de desarrollar diabetes gestacional si tiene antecedentes familiares de diabetes y tambin si tiene uno o ms de los siguientes factores de riesgo:  ndice de masa corporal superior a 30 (obesidad).  Embarazo previo con diabetes gestacional.  La edad avanzada en el momento del embarazo. Si se mantienen los niveles de glucosa en la sangre en un rango normal durante el embarazo, las mujeres pueden tener un embarazo saludable. Si los niveles de glucosa en la sangre no estn bien controlados, puede haber riesgos para usted, el feto o el recin nacido, o durante el trabajo de parto y el parto.  SNTOMAS  Si se presentan sntomas, stos son similares a los sntomas que normalmente experimentar durante el embarazo. Los sntomas de la diabetes gestacional son:   Aumento de la sed (polidipsia).  Aumento de la miccin (poliuria).  Orina con ms frecuencia durante la noche (nocturia).  Prdida de peso. La prdida de peso puede ser muy rpida.  Infecciones  frecuentes y recurrentes.  Cansancio (fatiga).  Debilidad.  Cambios en la visin, como visin borrosa.  Olor a fruta en el aliento.  Dolor abdominal. DIAGNSTICO La diabetes se diagnostica cuando hay aumento de los niveles de glucosa en la sangre. El nivel de glucosa en la sangre puede controlarse en uno o ms de los siguientes anlisis de sangre:  Medicin de glucosa en la sangre en ayunas. No se le permitir comer durante al menos 8 horas antes de que se tome una muestra de sangre.  Pruebas al azar de glucosa en la sangre. El nivel de glucosa en la sangre se controla en cualquier momento del da sin importar el momento en que haya comido.  Prueba de A1c (hemoglobina glucosilada) Una prueba de A1c proporciona informacin sobre el control de la glucosa en la sangre durante los ltimos 3 meses.  Prueba de tolerancia a la glucosa oral (PTGO). La glucosa en la sangre se mide despus de no haber comido (ayunas) durante una a tres horas y despus de beber una bebida que contenga glucosa. Dado que las hormonas que causan la resistencia a la insulina son ms altas alrededor de las semanas 24 a 28 de embarazo, generalmente se realiza una PTGO durante ese tiempo. Si tiene factores de riesgo de diabetes gestacional, su mdico puede hacerle estudios de deteccin antes de las 24semanas de embarazo. TRATAMIENTO   Usted tendr que tomar medicamentos para la diabetes o insulina diariamente para mantener los niveles de glucosa en la sangre en el rango deseado.  Usted tendr que combinar la dosis de insulina con la actividad fsica y la eleccin de alimentos saludables. El objetivo del   tratamiento es mantener el nivel de azcar en la sangre previo a comer (preprandial) y durante la noche entre 60 y 99mg/dl, durante todo el embarazo. El objetivo del tratamiento es mantener el nivel pico de azcar en la sangre despus de comer (glucosa posprandial) entre 100y 140mg/dl. INSTRUCCIONES PARA EL CUIDADO EN EL  HOGAR   Controle su nivel de hemoglobina A1c dos veces al ao.  Contrlese a diario el nivel de glucosa en la sangre segn las indicaciones de su mdico. Es comn realizar controles frecuentes de la glucosa en la sangre.  Supervise las cetonas en la orina cuando est enferma y segn las indicaciones de su mdico.  Tome el medicamento para la diabetes y adminstrese insulina segn las indicaciones de su mdico para mantener el nivel de glucosa en la sangre en el rango deseado.  Nunca se quede sin medicamento para la diabetes o sin insulina. Es necesario que la reciba todos los das.  Ajuste la insulina segn la ingesta de hidratos de carbono. Los hidratos de carbono pueden aumentar los niveles de glucosa en la sangre, pero deben incluirse en su dieta. Los hidratos de carbono aportan vitaminas, minerales y fibra que son una parte esencial de una dieta saludable. Los hidratos de carbono se encuentran en frutas, verduras, cereales integrales, productos lcteos, legumbres y alimentos que contienen azcares aadidos.  Consuma alimentos saludables. Alterne 3 comidas con 3 colaciones.  Aumente de peso saludablemente. El aumento del peso total vara de acuerdo con el ndice de masa corporal que tena antes del embarazo ([facility]).  Lleve una tarjeta de alerta mdica o use una pulsera o medalla de alerta mdica.  Lleve con usted una colacin de 15gramos de hidratos de carbono en todo momento para controlar los niveles bajos de glucosa en la sangre (hipoglucemia). Algunos ejemplos de colaciones de 15gramos de hidratos de carbono son los siguientes:  Tabletas de glucosa, 3 o 4.  Gel de glucosa, tubo de 15 gramos.  Pasas de uva, 2 cucharadas (24 g).  Caramelos de goma, 6.  Galletas de animales, 8.  Jugo de fruta, gaseosa comn, o leche descremada, 4 onzas (120 ml).  Pastillas de goma, 9.  Reconocer la hipoglucemia. Durante el embarazo la hipoglucemia se produce cuando hay niveles de glucosa en la  sangre de 60 mg/dl o menos. El riesgo de hipoglucemia aumenta durante el ayuno o cuando se saltea las comidas, durante o despus de realizar ejercicio intenso y mientras duerme. Los sntomas de hipoglucemia son:  Temblores o sacudidas.  Disminucin de la capacidad de concentracin.  Sudoracin.  Aumento de la frecuencia cardaca.  Dolor de cabeza.  Sequedad en la boca.  Hambre.  Irritabilidad.  Ansiedad.  Sueo agitado.  Alteracin del habla o de la coordinacin.  Confusin.  Tratar la hipoglucemia rpidamente. Si usted est alerta y puede tragar con seguridad, siga la regla de 15/15 que consiste en:  Tome entre 15 y 20gramos de glucosa de accin rpida o carbohidratos. Las opciones de accin rpida son un gel de glucosa, tabletas de glucosa, o 4 onzas (120 ml) de jugo de frutas, gaseosa comn, o leche baja en grasa.  Compruebe su nivel de glucosa en la sangre 15 minutos despus de tomar la glucosa.  Tome entre 15 y 20 gramos ms de glucosa si el nivel de glucosa en la sangre todava es de 70mg/dl o inferior.  Ingiera una comida o una colacin en el lapso de 1 hora una vez que los niveles de glucosa en la sangre vuelven   a la normalidad.  Est atento a la poliuria (miccin excesiva) y la polidipsia (sensacin de mucha sed), que son los primeros signos de la hiperglucemia. El reconocimiento temprano de la hiperglucemia permite un tratamiento oportuno. Trate la hiperglucemia segn le indic su mdico.  Haga actividad fsica por lo menos 30minutos al da o como lo indique su mdico. Se recomienda que 30 minutos despus de cada comida, realice diez minutos de actividad fsica para controlar los niveles de glucosa postprandial en la sangre.  Ajuste su dosis de insulina y la ingesta de alimentos, segn sea necesario, si inicia un nuevo ejercicio o deporte.  Siga su plan para los das de enfermedad cuando no pueda comer o beber como de costumbre.  Evite el tabaco y el  alcohol.  Concurra a todas las visitas de control como se lo haya indicado el mdico.  Siga el consejo del mdico respecto a los controles prenatales y posteriores al parto (postparto), las visitas, la planificacin de las comidas, el ejercicio, los medicamentos, las vitaminas, los anlisis de sangre, otras pruebas mdicas y actividades fsicas.  Realice diariamente el cuidado de la piel y de los pies. Examine su piel y los pies diariamente para ver si tiene cortes, moretones, enrojecimiento, problemas en las uas, sangrado, ampollas o llagas.  Cepllese los dientes y encas por lo menos dos veces al da y use hilo dental al menos una vez por da. Concurra regularmente a las visitas de control con el dentista.  Programe un examen de vista durante el primer trimestre de su embarazo o como lo indique su mdico.  Comparta su plan de control de diabetes en el trabajo o en la escuela.  Mantngase al da con las vacunas.  Aprenda a manejar el estrs.  Obtenga la mayor cantidad posible de informacin sobre la diabetes y solicite ayuda siempre que sea necesario.  Obtenga informacin sobre el amamantamiento y analice esta posibilidad.  Debe controlar el nivel de azcar en la sangre de 6a 12semanas despus del parto. Esto se hace con una prueba de tolerancia a la glucosa oral (PTGO). SOLICITE ATENCIN MDICA SI:   No puede comer alimentos o beber por ms de 6 horas.  Tuvo nuseas o ha vomitado durante ms de 6 horas.  Tiene un nivel de glucosa en la sangre de 200 mg/dl y cetonas en la orina.  Presenta algn cambio en el estado mental.  Desarrolla problemas de visin.  Sufre un dolor persistente de cabeza.  Siente dolor o molestias en la parte superior del abdomen.  Desarrolla una enfermedad grave adicional.  Tuvo diarrea durante ms de 6 horas.  Ha estado enfermo o ha tenido fiebre durante un par de das y no mejora. SOLICITE ATENCIN MDICA DE INMEDIATO SI:   Tiene dificultad  para respirar.  Ya no siente los movimientos del beb.  Est sangrando o tiene flujo vaginal.  Comienza a tener contracciones o trabajo de parto prematuro. ASEGRESE DE QUE:  Comprende estas instrucciones.  Controlar su afeccin.  Recibir ayuda de inmediato si no mejora o si empeora. Document Released: 03/25/2005 Document Revised: 10/30/2013 [employer] Patient Information 2015 [employer]. This information is not intended to replace advice given to you by your health care provider. Make sure you discuss any questions you have with your health care provider.  Segundo trimestre de embarazo (Second Trimester of Pregnancy) El segundo trimestre va desde la semana13 hasta la 28, desde el cuarto hasta el sexto mes, y suele ser el momento en el que mejor se   siente. Su organismo se ha adaptado a estar embarazada y comienza a sentirse fsicamente mejor. En general, las nuseas matutinas han disminuido o han desaparecido completamente, p El segundo trimestre es tambin la poca en la que el feto se desarrolla rpidamente. Hacia el final del sexto mes, el feto mide aproximadamente 9pulgadas (23cm) y pesa alrededor de 1 libras (700g). Es probable que sienta que el beb se mueve (da pataditas) entre las 18 y 20semanas del embarazo. CAMBIOS EN EL ORGANISMO Su organismo atraviesa por muchos cambios durante el embarazo, y estos varan de una mujer a otra.   Seguir aumentando de peso. Notar que la parte baja del abdomen sobresale.  Podrn aparecer las primeras estras en las caderas, el abdomen y las mamas.  Es posible que tenga dolores de cabeza que pueden aliviarse con los medicamentos que su mdico autorice.  Tal vez tenga necesidad de orinar con ms frecuencia porque el feto est ejerciendo presin sobre la vejiga.  Debido al embarazo podr sentir acidez estomacal con frecuencia.  Puede estar estreida, ya que ciertas hormonas enlentecen los movimientos de los msculos que empujan los  desechos a travs de los intestinos.  Pueden aparecer hemorroides o abultarse e hincharse las venas (venas varicosas).  Puede tener dolor de espalda que se debe al aumento de peso y a que las hormonas del embarazo relajan las articulaciones entre los huesos de la pelvis, y como consecuencia de la modificacin del peso y los msculos que mantienen el equilibrio.  Las mamas seguirn creciendo y le dolern.  Las encas pueden sangrar y estar sensibles al cepillado y al hilo dental.  Pueden aparecer zonas oscuras o manchas (cloasma, mscara del embarazo) en el rostro que probablemente se atenuarn despus del nacimiento del beb.  Es posible que se forme una lnea oscura desde el ombligo hasta la zona del pubis (linea nigra) que probablemente se atenuarn despus del nacimiento del beb.  Tal vez haya cambios en el cabello que pueden incluir su engrosamiento, crecimiento rpido y cambios en la textura. Adems, a algunas mujeres se les cae el cabello durante o despus del embarazo, o tienen el cabello seco o fino. Lo ms probable es que el cabello se le normalice despus del nacimiento del beb. QU DEBE ESPERAR EN LAS CONSULTAS PRENATALES Durante una visita prenatal de rutina:  La pesarn para asegurarse de que usted y el feto estn creciendo normalmente.  Le tomarn la presin arterial.  Le medirn el abdomen para controlar el desarrollo del beb.  Se escucharn los latidos cardacos fetales.  Se evaluarn los resultados de los estudios solicitados en visitas anteriores. El mdico puede preguntarle lo siguiente:  Cmo se siente.  Si siente los movimientos del beb.  Si ha tenido sntomas anormales, como prdida de lquido, sangrado, dolores de cabeza intensos o clicos abdominales.  Si tiene alguna pregunta. Otros estudios que podrn realizarse durante el segundo trimestre incluyen lo siguiente:  Anlisis de sangre para detectar:  Concentraciones de hierro bajas  (anemia).  Diabetes gestacional (entre la semana 24 y la 28).  Anticuerpos Rh.  Anlisis de orina para detectar infecciones, diabetes o protenas en la orina.  Una ecografa para confirmar que el beb crece y se desarrolla correctamente.  Una amniocentesis para diagnosticar posibles problemas genticos.  Estudios del feto para descartar espina bfida y sndrome de Down. INSTRUCCIONES PARA EL CUIDADO EN EL HOGAR   Evite fumar, consumir hierbas, beber alcohol y tomar frmacos que no le hayan recetado. Estas sustancias qumicas afectan la formacin   y el desarrollo del beb.  Siga las indicaciones del mdico en relacin con el uso de medicamentos. Durante el embarazo, hay medicamentos que son seguros de tomar y otros que no.  Haga actividad fsica solo en la forma indicada por el mdico. Sentir clicos uterinos es un buen signo para detener la actividad fsica.  Contine comiendo alimentos que sanos con regularidad.  Use un sostn que le brinde buen soporte si le duelen las mamas.  No se d baos de inmersin en agua caliente, baos turcos ni saunas.  Colquese el cinturn de seguridad cuando conduzca.  No coma carne cruda ni queso sin cocinar; evite el contacto con las bandejas sanitarias de los gatos y la tierra que estos animales usan. Estos elementos contienen grmenes que pueden causar defectos congnitos en el beb.  Tome las vitaminas prenatales.  Si est estreida, pruebe un laxante suave (si el mdico lo autoriza). Consuma ms alimentos ricos en fibra, como vegetales y frutas frescos y cereales integrales. Beba gran cantidad de lquido para mantener la orina de tono claro o color amarillo plido.  Dese baos de asiento con agua tibia para aliviar el dolor o las molestias causadas por las hemorroides. Use una crema para las hemorroides si el mdico la autoriza.  Si tiene venas varicosas, use medias de descanso. Eleve los pies durante 15minutos, 3 o 4veces por da. Limite la  cantidad de sal en su dieta.  No levante objetos pesados, use zapatos de tacones bajos y mantenga una buena postura.  Descanse con las piernas elevadas si tiene calambres o dolor de cintura.  Visite a su dentista si an no lo ha hecho durante el embarazo. Use un cepillo de dientes blando para higienizarse los dientes y psese el hilo dental con suavidad.  Puede seguir manteniendo relaciones sexuales, a menos que el mdico le indique lo contrario.  Concurra a todas las visitas prenatales segn las indicaciones de su mdico. SOLICITE ATENCIN MDICA SI:   Tiene mareos.  Siente clicos leves, presin en la pelvis o dolor persistente en el abdomen.  Tiene nuseas, vmitos o diarrea persistentes.  Tiene secrecin vaginal con mal olor.  Siente dolor al orinar. SOLICITE ATENCIN MDICA DE INMEDIATO SI:   Tiene fiebre.  Tiene una prdida de lquido por la vagina.  Tiene sangrado o pequeas prdidas vaginales.  Siente dolor intenso o clicos en el abdomen.  Sube o baja de peso rpidamente.  Tiene dificultad para respirar y siente dolor de pecho.  Sbitamente se le hinchan mucho el rostro, las manos, los tobillos, los pies o las piernas.  No ha sentido los movimientos del beb durante una hora.  Siente un dolor de cabeza intenso que no se alivia con medicamentos.  Hay cambios en la visin. Document Released: 03/25/2005 Document Revised: 06/20/2013 [employer] Patient Information 2015 [employer]. This information is not intended to replace advice given to you by your health care provider. Make sure you discuss any questions you have with your health care provider.  Lactancia materna (Breastfeeding) Decidir amamantar es una de las mejores elecciones que puede hacer por usted y su beb. El cambio hormonal durante el embarazo produce el desarrollo del tejido mamario y aumenta la cantidad y el tamao de los conductos galactforos. Estas hormonas tambin permiten que las protenas,  los azcares y las grasas de la sangre produzcan la leche materna en las glndulas productoras de leche. Las hormonas impiden que la leche materna sea liberada antes del nacimiento del beb, adems de impulsar el flujo   de leche luego del nacimiento. Una vez que ha comenzado a amamantar, pensar en el beb, as como la succin o el llanto, pueden estimular la liberacin de leche de las glndulas productoras de leche.  LOS BENEFICIOS DE AMAMANTAR Para el beb  La primera leche (calostro) ayuda a mejorar el funcionamiento del sistema digestivo del beb.  La leche tiene anticuerpos que ayudan a prevenir las infecciones en el beb.  El beb tiene una menor incidencia de asma, alergias y del sndrome de muerte sbita del lactante.  Los nutrientes en la leche materna son mejores para el beb que la leche maternizada y estn preparados exclusivamente para cubrir las necesidades del beb.  La leche materna mejora el desarrollo cerebral del beb.  Es menos probable que el beb desarrolle otras enfermedades, como obesidad infantil, asma o diabetes mellitus de tipo 2. Para usted   La lactancia materna favorece el desarrollo de un vnculo muy especial entre la madre y el beb.  Es conveniente. La leche materna siempre est disponible a la temperatura correcta y es econmica.  La lactancia materna ayuda a quemar caloras y a perder el peso ganado durante el embarazo.  Favorece la contraccin del tero al tamao que tena antes del embarazo de manera ms rpida y disminuye el sangrado (loquios) despus del parto.  La lactancia materna contribuye a reducir el riesgo de desarrollar diabetes mellitus de tipo 2, osteoporosis o cncer de mama o de ovario en el futuro. SIGNOS DE QUE EL BEB EST HAMBRIENTO Primeros signos de hambre  Aumenta su estado de alerta o actividad.  Se estira.  Mueve la cabeza de un lado a otro.  Mueve la cabeza y abre la boca cuando se le toca la mejilla o la comisura de la  boca (reflejo de bsqueda).  Aumenta las vocalizaciones, tales como sonidos de succin, se relame los labios, emite arrullos, suspiros, o chirridos.  Mueve la mano hacia la boca.  Se chupa con ganas los dedos o las manos. Signos tardos de hambre  Est agitado.  Llora de manera intermitente. Signos de hambre extrema Los signos de hambre extrema requerirn que lo calme y lo consuele antes de que el beb pueda alimentarse adecuadamente. No espere a que se manifiesten los siguientes signos de hambre extrema para comenzar a amamantar:   Agitacin.  Llanto intenso y fuerte.   Gritos. INFORMACIN BSICA SOBRE LA LACTANCIA MATERNA Iniciacin de la lactancia materna  Encuentre un lugar cmodo para sentarse o acostarse, con un buen respaldo para el cuello y la espalda.  Coloque una almohada o una manta enrollada debajo del beb para acomodarlo a la altura de la mama (si est sentada). Las almohadas para amamantar se han diseado especialmente a fin de servir de apoyo para los brazos y el beb mientras amamanta.  Asegrese de que el abdomen del beb est frente al suyo.  Masajee suavemente la mama. Con las yemas de los dedos, masajee la pared del pecho hacia el pezn en un movimiento circular. Esto estimula el flujo de leche. Es posible que deba continuar este movimiento mientras amamanta si la leche fluye lentamente.  Sostenga la mama con el pulgar por arriba del pezn y los otros 4 dedos por debajo de la mama. Asegrese de que los dedos se encuentren lejos del pezn y de la boca del beb.  Empuje suavemente los labios del beb con el pezn o con el dedo.  Cuando la boca del beb se abra lo suficiente, acrquelo rpidamente a la mama   e introduzca todo el pezn y la zona oscura que lo rodea (areola), tanto como sea posible, dentro de la boca del beb.  Debe haber ms areola visible por arriba del labio superior del beb que por debajo del labio inferior.  La lengua del beb debe estar  entre la enca inferior y la mama.  Asegrese de que la boca del beb est en la posicin correcta alrededor del pezn (prendida). Los labios del beb deben crear un sello sobre la mama y estar doblados hacia afuera (invertidos).  Es comn que el beb succione durante 2 a 3 minutos para que comience el flujo de leche materna. Cmo debe prenderse Es muy importante que le ensee al beb cmo prenderse adecuadamente a la mama. Si el beb no se prende adecuadamente, puede causarle dolor en el pezn y reducir la produccin de leche materna, y hacer que el beb tenga un escaso aumento de peso. Adems, si el beb no se prende adecuadamente al pezn, puede tragar aire durante la alimentacin. Esto puede causarle molestias al beb. Hacer eructar al beb al cambiar de mama puede ayudarlo a liberar el aire. Sin embargo, ensearle al beb cmo prenderse a la mama adecuadamente es la mejor manera de evitar que se sienta molesto por tragar aire mientras se alimenta. Signos de que el beb se ha prendido adecuadamente al pezn:   Tironea o succiona de modo silencioso, sin causarle dolor.  Se escucha que traga cada 3 o 4 succiones.   Hay movimientos musculares por arriba y por delante de sus odos al succionar. Signos de que el beb no se ha prendido adecuadamente al pezn:   Hace ruidos de succin o de chasquido mientras se alimenta.  Siente dolor en el pezn. Si cree que el beb no se prendi correctamente, deslice el dedo en la comisura de la boca y colquelo entre las encas del beb para interrumpir la succin. Intente comenzar a amamantar nuevamente. Signos de lactancia materna exitosa Signos del beb:   Disminuye gradualmente el nmero de succiones o cesa la succin por completo.  Se duerme.  Relaja el cuerpo.  Retiene una pequea cantidad de leche en la boca.  Se desprende solo del pecho. Signos que presenta usted:  Las mamas han aumentado la firmeza, el peso y el tamao 1 a 3 horas despus  de amamantar.  Estn ms blandas inmediatamente despus de amamantar.  Un aumento del volumen de leche, y tambin un cambio en su consistencia y color se producen hacia el quinto da de lactancia materna.  Los pezones no duelen, ni estn agrietados ni sangran. Signos de que su beb recibe la cantidad de leche suficiente  Moja al menos 3 paales en 24 horas. La orina debe ser clara y de color amarillo plido a los 5 das de vida.  Defeca al menos 3 veces en 24 horas a los 5 das de vida. La materia fecal debe ser blanda y amarillenta.  Defeca al menos 3 veces en 24 horas a los 7 das de vida. La materia fecal debe ser grumosa y amarillenta.  No registra una prdida de peso mayor del 10% del peso al nacer durante los primeros 3 das de vida.  Aumenta de peso un promedio de 4 a 7onzas (113 a 198g) por semana despus de los 4 das de vida.  Aumenta de peso, diariamente, de manera uniforme a partir de los 5 das de vida, sin registrar prdida de peso despus de las 2semanas de vida. Despus de alimentarse,   es posible que el beb regurgite una pequea cantidad. Esto es frecuente. FRECUENCIA Y DURACIN DE LA LACTANCIA MATERNA El amamantamiento frecuente la ayudar a producir ms leche y a prevenir problemas de dolor en los pezones e hinchazn en las mamas. Alimente al beb cuando muestre signos de hambre o si siente la necesidad de reducir la congestin de las mamas. Esto se denomina "lactancia a demanda". Evite el uso del chupete mientras trabaja para establecer la lactancia (las primeras 4 a 6 semanas despus del nacimiento del beb). Despus de este perodo, podr ofrecerle un chupete. Las investigaciones demostraron que el uso del chupete durante el primer ao de vida del beb disminuye el riesgo de desarrollar el sndrome de muerte sbita del lactante (SMSL). Permita que el nio se alimente en cada mama todo lo que desee. Contine amamantando al beb hasta que haya terminado de alimentarse.  Cuando el beb se desprende o se queda dormido mientras se est alimentando de la primera mama, ofrzcale la segunda. Debido a que, con frecuencia, los recin nacidos permanecen somnolientos las primeras semanas de vida, es posible que deba despertar al beb para alimentarlo. Los horarios de lactancia varan de un beb a otro. Sin embargo, las siguientes reglas pueden servir como gua para ayudarla a garantizar que el beb se alimenta adecuadamente:  Se puede amamantar a los recin nacidos (bebs de 4 semanas o menos de vida) cada 1 a 3 horas.  No deben transcurrir ms de 3 horas durante el da o 5 horas durante la noche sin que se amamante a los recin nacidos.  Debe amamantar al beb 8 veces como mnimo en un perodo de 24 horas, hasta que comience a introducir slidos en su dieta, a los 6 meses de vida aproximadamente. EXTRACCIN DE LECHE MATERNA La extraccin y el almacenamiento de la leche materna le permiten asegurarse de que el beb se alimente exclusivamente de leche materna, aun en momentos en los que no puede amamantar. Esto tiene especial importancia si debe regresar al trabajo en el perodo en que an est amamantando o si no puede estar presente en los momentos en que el beb debe alimentarse. Su asesor en lactancia puede orientarla sobre cunto tiempo es seguro almacenar leche materna.  El sacaleche es un aparato que le permite extraer leche de la mama a un recipiente estril. Luego, la leche materna extrada puede almacenarse en un refrigerador o congelador. Algunos sacaleches son manuales, mientras que otros son elctricos. Consulte a su asesor en lactancia qu tipo ser ms conveniente para usted. Los sacaleches se pueden comprar; sin embargo, algunos hospitales y grupos de apoyo a la lactancia materna alquilan sacaleches mensualmente. Un asesor en lactancia puede ensearle cmo extraer leche materna manualmente, en caso de que prefiera no usar un sacaleche.  CMO CUIDAR LAS MAMAS DURANTE  LA LACTANCIA MATERNA Los pezones se secan, agrietan y duelen durante la lactancia materna. Las siguientes recomendaciones pueden ayudarla a mantener las mamas humectadas y sanas:  Evite usar jabn en los pezones.  Use un sostn de soporte. Aunque no son esenciales, las camisetas sin mangas o los sostenes especiales para amamantar estn diseados para acceder fcilmente a las mamas, para amamantar sin tener que quitarse todo el sostn o la camiseta. Evite usar sostenes con aro o sostenes muy ajustados.  Seque al aire sus pezones durante 3 a 4minutos despus de amamantar al beb.  Utilice solo apsitos de algodn en el sostn para absorber las prdidas de leche. La prdida de un poco de leche   materna entre las tomas es normal.  Utilice lanolina sobre los pezones luego de amamantar. La lanolina ayuda a mantener la humedad normal de la piel. Si usa lanolina pura, no tiene que lavarse los pezones antes de volver a alimentar al beb. La lanolina pura no es txica para el beb. Adems, puede extraer manualmente algunas gotas de leche materna y masajear suavemente esa leche sobre los pezones, para que la leche se seque al aire. Durante las primeras semanas despus de dar a luz, algunas mujeres pueden experimentar hinchazn en las mamas (congestin mamaria). La congestin puede hacer que sienta las mamas pesadas, calientes y sensibles al tacto. El pico de la congestin ocurre dentro de los 3 a 5 das despus del parto. Las siguientes recomendaciones pueden ayudarla a aliviar la congestin:  Vace por completo las mamas al amamantar o extraer leche. Puede aplicar calor hmedo en las mamas (en la ducha o con toallas hmedas para manos) antes de amamantar o extraer leche. Esto aumenta la circulacin y ayuda a que la leche fluya. Si el beb no vaca por completo las mamas cuando lo amamanta, extraiga la leche restante despus de que haya finalizado.  Use un sostn ajustado (para amamantar o comn) o una camiseta  sin mangas durante 1 o 2 das para indicar al cuerpo que disminuya ligeramente la produccin de leche.  Aplique compresas de hielo sobre las mamas, a menos que le resulte demasiado incmodo.  Asegrese de que el beb est prendido y se encuentre en la posicin correcta mientras lo alimenta. Si la congestin persiste luego de 48 horas o despus de seguir estas recomendaciones, comunquese con su mdico o un asesor en lactancia. RECOMENDACIONES GENERALES PARA EL CUIDADO DE LA SALUD DURANTE LA LACTANCIA MATERNA  Consuma alimentos saludables. Alterne comidas y colaciones, y coma 3 de cada una por da. Dado que lo que come afecta la leche materna, es posible que algunas comidas hagan que su beb se vuelva ms irritable de lo habitual. Evite comer este tipo de alimentos si percibe que afectan de manera negativa al beb.  Beba leche, jugos de fruta y agua para satisfacer su sed (aproximadamente 10 vasos al da).  Descanse con frecuencia, reljese y tome sus vitaminas prenatales para evitar la fatiga, el estrs y la anemia.  Contine con los autocontroles de la mama.  Evite masticar y fumar tabaco.  Evite el consumo de alcohol y drogas. Algunos medicamentos, que pueden ser perjudiciales para el beb, pueden pasar a travs de la leche materna. Es importante que consulte a su mdico antes de tomar cualquier medicamento, incluidos todos los medicamentos recetados y de venta libre, as como los suplementos vitamnicos y herbales. Puede quedar embarazada durante la lactancia. Si desea controlar la natalidad, consulte a su mdico cules son las opciones ms seguras para el beb. SOLICITE ATENCIN MDICA SI:   Usted siente que quiere dejar de amamantar o se siente frustrada con la lactancia.  Siente dolor en las mamas o en los pezones.  Sus pezones estn agrietados o sangran.  Sus pechos estn irritados, sensibles o calientes.  Tiene un rea hinchada en cualquiera de las mamas.  Siente escalofros  o fiebre.  Tiene nuseas o vmitos.  Presenta una secrecin de otro lquido distinto de la leche materna de los pezones.  Sus mamas no se llenan antes de amamantar al beb para el quinto da despus del parto.  Se siente triste y deprimida.  El beb est demasiado somnoliento como para comer bien.  El beb   tiene problemas para dormir.  Moja menos de 3 paales en 24 horas.  Defeca menos de 3 veces en 24 horas.  La piel del beb o la parte blanca de los ojos se vuelven amarillentas.  El beb no ha aumentado de peso a los 5 das de vida. SOLICITE ATENCIN MDICA DE INMEDIATO SI:   El beb est muy cansado (letargo) y no se quiere despertar para comer.  Le sube la fiebre sin causa. Document Released: 06/15/2005 Document Revised: 06/20/2013 ExitCare Patient Information 2015 ExitCare, [employer]. This information is not intended to replace advice given to you by your health care provider. Make sure you discuss any questions you have with your health care provider.  

## 2014-05-17 NOTE — Progress Notes (Signed)
FBS 77-94 (4 of 27 out of range)  2 hr pp 78-125 almost all in range Needs u/s for growth Spanish interpreter: Raquel used

## 2014-05-17 NOTE — Progress Notes (Signed)
Growth U/S with Radiology 06/07/14 @ 145p.  Spanish interpreter [employer] Mora present.

## 2014-06-07 ENCOUNTER — Ambulatory Visit (HOSPITAL_COMMUNITY)
Admission: RE | Admit: 2014-06-07 | Discharge: 2014-06-07 | Disposition: A | Discharge status: Home / Self Care | Payer: Medicaid Other | Source: Ambulatory Visit | Attending: Family Medicine | Admitting: Family Medicine

## 2014-06-07 ENCOUNTER — Ambulatory Visit (INDEPENDENT_AMBULATORY_CARE_PROVIDER_SITE_OTHER): Payer: Self-pay | Admitting: Advanced Practice Midwife

## 2014-06-07 VITALS — BP 97/56 | HR 85 | Wt 132.2 lb

## 2014-06-07 DIAGNOSIS — O24312 Unspecified pre-existing diabetes mellitus in pregnancy, second trimester: Secondary | ICD-10-CM

## 2014-06-07 DIAGNOSIS — O24912 Unspecified diabetes mellitus in pregnancy, second trimester: Secondary | ICD-10-CM

## 2014-06-07 DIAGNOSIS — Z3A28 28 weeks gestation of pregnancy: Secondary | ICD-10-CM | POA: Insufficient documentation

## 2014-06-07 DIAGNOSIS — O09519 Supervision of elderly primigravida, unspecified trimester: Secondary | ICD-10-CM | POA: Insufficient documentation

## 2014-06-07 DIAGNOSIS — Z23 Encounter for immunization: Secondary | ICD-10-CM

## 2014-06-07 LAB — POCT URINALYSIS DIP (DEVICE)
BILIRUBIN URINE: NEGATIVE
Glucose, UA: NEGATIVE mg/dL
Hgb urine dipstick: NEGATIVE
KETONES UR: NEGATIVE mg/dL
Leukocytes, UA: NEGATIVE
NITRITE: NEGATIVE
PH: 5.5 (ref 5.0–8.0)
Protein, ur: NEGATIVE mg/dL
Specific Gravity, Urine: <=1.005 (ref 1.005–1.030)
Urobilinogen, UA: 0.2 mg/dL (ref 0.0–1.0)

## 2014-06-07 MED ORDER — TETANUS-DIPHTH-ACELL PERTUSSIS 5-2.5-18.5 LF-MCG/0.5 IM SUSP
0.5000 mL | Freq: Once | INTRAMUSCULAR | Status: AC
  Administered 2014-06-07: 0.5 mL via INTRAMUSCULAR

## 2014-06-07 NOTE — Progress Notes (Signed)
Doing well.  Good fetal movement, denies vaginal bleeding, LOF, regular contractions.  Reviewed glucose log.  Fasting 85-91. PP 95-125 with one outlier at 137.  Spanish interpreter [employer]quel Mora used for all communication.

## 2014-06-08 LAB — CBC
HCT: 33.7 % — ABNORMAL LOW (ref 36.0–46.0)
HEMOGLOBIN: 11.4 g/dL — AB (ref 12.0–15.0)
MCH: 28.6 pg (ref 26.0–34.0)
MCHC: 33.8 g/dL (ref 30.0–36.0)
MCV: 84.7 fL (ref 78.0–100.0)
MPV: 10.7 fL (ref 9.4–12.4)
PLATELETS: 262 10*3/uL (ref 150–400)
RBC: 3.98 MIL/uL (ref 3.87–5.11)
RDW: 14.9 % (ref 11.5–15.5)
WBC: 11.5 10*3/uL — AB (ref 4.0–10.5)

## 2014-06-08 LAB — GLUCOSE TOLERANCE, 1 HOUR (50G) W/O FASTING: GLUCOSE 1 HOUR GTT: 91 mg/dL (ref 70–140)

## 2014-06-08 LAB — HIV ANTIBODY (ROUTINE TESTING W REFLEX): HIV: NONREACTIVE

## 2014-06-08 LAB — RPR

## 2014-06-12 ENCOUNTER — Encounter: Payer: Self-pay | Admitting: *Deleted

## 2014-06-25 ENCOUNTER — Telehealth: Payer: Self-pay | Admitting: *Deleted

## 2014-06-25 NOTE — Telephone Encounter (Signed)
Per chart review saw fetal echo ordered - called Dr. Casilda Carlsotton's office , echo done, they will fax results.

## 2014-06-28 ENCOUNTER — Ambulatory Visit (INDEPENDENT_AMBULATORY_CARE_PROVIDER_SITE_OTHER): Payer: Self-pay | Admitting: Family

## 2014-06-28 VITALS — BP 100/54 | HR 75 | Temp 98.1°F | Wt 137.0 lb

## 2014-06-28 DIAGNOSIS — O24313 Unspecified pre-existing diabetes mellitus in pregnancy, third trimester: Secondary | ICD-10-CM

## 2014-06-28 DIAGNOSIS — O24913 Unspecified diabetes mellitus in pregnancy, third trimester: Secondary | ICD-10-CM

## 2014-06-28 LAB — POCT URINALYSIS DIP (DEVICE)
Bilirubin Urine: NEGATIVE
GLUCOSE, UA: NEGATIVE mg/dL
Hgb urine dipstick: NEGATIVE
Ketones, ur: NEGATIVE mg/dL
LEUKOCYTES UA: NEGATIVE
NITRITE: NEGATIVE
PROTEIN: NEGATIVE mg/dL
SPECIFIC GRAVITY, URINE: 1.015 (ref 1.005–1.030)
UROBILINOGEN UA: 0.2 mg/dL (ref 0.0–1.0)
pH: 7 (ref 5.0–8.0)

## 2014-06-28 NOTE — Progress Notes (Signed)
FBS 68-94 (4/21 abnl);  Postprandials only 2 PP abnl (post dinner).  Scheduled follow-up ultrasound for growth.  Begin fetal testing next week.

## 2014-06-28 NOTE — Progress Notes (Signed)
Growth U/S 07/12/14 @ 915a (ok per Margarita MailW. Karim, CNM).

## 2014-06-29 DIAGNOSIS — E119 Type 2 diabetes mellitus without complications: Secondary | ICD-10-CM

## 2014-06-29 HISTORY — DX: Type 2 diabetes mellitus without complications: E11.9

## 2014-07-05 ENCOUNTER — Ambulatory Visit: Payer: Self-pay | Admitting: *Deleted

## 2014-07-05 DIAGNOSIS — Z3689 Encounter for other specified antenatal screening: Secondary | ICD-10-CM

## 2014-07-05 NOTE — Progress Notes (Addendum)
Pt does not meet criteria at this time for NST,Pt was thought to not meet criteria  due to being diabetic without need of  medication and with good control.  Discussed with D. Day RNC

## 2014-07-05 NOTE — Progress Notes (Addendum)
After further review with D. Day RNC, pt does meet the criteria for biweekly testing due to having preexisting  Diabetes before pregnancy.

## 2014-07-12 ENCOUNTER — Ambulatory Visit (HOSPITAL_COMMUNITY)
Admission: RE | Admit: 2014-07-12 | Discharge: 2014-07-12 | Disposition: A | Discharge status: Home / Self Care | Payer: Medicaid Other | Source: Ambulatory Visit | Attending: Family | Admitting: Family

## 2014-07-12 ENCOUNTER — Ambulatory Visit (INDEPENDENT_AMBULATORY_CARE_PROVIDER_SITE_OTHER): Payer: Medicaid Other | Admitting: Obstetrics & Gynecology

## 2014-07-12 VITALS — BP 101/59 | HR 77 | Wt 140.4 lb

## 2014-07-12 DIAGNOSIS — O24913 Unspecified diabetes mellitus in pregnancy, third trimester: Secondary | ICD-10-CM | POA: Diagnosis not present

## 2014-07-12 DIAGNOSIS — O24410 Gestational diabetes mellitus in pregnancy, diet controlled: Secondary | ICD-10-CM

## 2014-07-12 DIAGNOSIS — Z3A33 33 weeks gestation of pregnancy: Secondary | ICD-10-CM | POA: Insufficient documentation

## 2014-07-12 DIAGNOSIS — O24313 Unspecified pre-existing diabetes mellitus in pregnancy, third trimester: Secondary | ICD-10-CM

## 2014-07-12 LAB — POCT URINALYSIS DIP (DEVICE)
BILIRUBIN URINE: NEGATIVE
Glucose, UA: NEGATIVE mg/dL
Ketones, ur: NEGATIVE mg/dL
LEUKOCYTES UA: NEGATIVE
NITRITE: NEGATIVE
Protein, ur: NEGATIVE mg/dL
SPECIFIC GRAVITY, URINE: 1.015 (ref 1.005–1.030)
Urobilinogen, UA: 0.2 mg/dL (ref 0.0–1.0)
pH: 7 (ref 5.0–8.0)

## 2014-07-12 NOTE — Progress Notes (Signed)
NST reactive. All BG in range, may test 2/day. Continue fetal testing

## 2014-07-12 NOTE — Progress Notes (Signed)
Used Interpreter [employer] Almonte.Patient states thought she did not need antenatal testing- informed her we will have provider review her chart after todays NST and decide if further antenatal testing needed.

## 2014-07-16 ENCOUNTER — Inpatient Hospital Stay (HOSPITAL_COMMUNITY)
Admission: AD | Admit: 2014-07-16 | Discharge: 2014-07-16 | Disposition: A | Discharge status: Home / Self Care | Payer: Medicaid Other | Source: Ambulatory Visit | Attending: Family Medicine | Admitting: Family Medicine

## 2014-07-16 ENCOUNTER — Encounter (HOSPITAL_COMMUNITY): Payer: Self-pay | Admitting: *Deleted

## 2014-07-16 ENCOUNTER — Inpatient Hospital Stay (HOSPITAL_COMMUNITY): Payer: Medicaid Other

## 2014-07-16 ENCOUNTER — Ambulatory Visit (INDEPENDENT_AMBULATORY_CARE_PROVIDER_SITE_OTHER): Payer: Medicaid Other | Admitting: *Deleted

## 2014-07-16 VITALS — BP 90/59 | HR 79

## 2014-07-16 DIAGNOSIS — Z3A33 33 weeks gestation of pregnancy: Secondary | ICD-10-CM | POA: Diagnosis not present

## 2014-07-16 DIAGNOSIS — O24419 Gestational diabetes mellitus in pregnancy, unspecified control: Secondary | ICD-10-CM | POA: Insufficient documentation

## 2014-07-16 DIAGNOSIS — O24913 Unspecified diabetes mellitus in pregnancy, third trimester: Secondary | ICD-10-CM

## 2014-07-16 DIAGNOSIS — O09522 Supervision of elderly multigravida, second trimester: Secondary | ICD-10-CM

## 2014-07-16 DIAGNOSIS — O34219 Maternal care for unspecified type scar from previous cesarean delivery: Secondary | ICD-10-CM

## 2014-07-16 DIAGNOSIS — O24313 Unspecified pre-existing diabetes mellitus in pregnancy, third trimester: Secondary | ICD-10-CM

## 2014-07-16 DIAGNOSIS — O09523 Supervision of elderly multigravida, third trimester: Secondary | ICD-10-CM | POA: Insufficient documentation

## 2014-07-16 DIAGNOSIS — O3421 Maternal care for scar from previous cesarean delivery: Secondary | ICD-10-CM | POA: Insufficient documentation

## 2014-07-16 HISTORY — DX: Type 2 diabetes mellitus without complications: E11.9

## 2014-07-16 NOTE — Progress Notes (Signed)
Interpreter - Luther RedoAdriana Villa present for part of encounter.   NST results called to Dr. Adrian BlackwaterStinson - pt to be taken to MAU for further monitoring and BPP.  Pt informed of results and plan of care by [facility] Interpreter # [phone].  She voiced understanding.

## 2014-07-16 NOTE — MAU Provider Note (Signed)
Chief Complaint:  Non reassuring NST   First Provider Initiated Contact with Patient 07/16/14 1828     HPI: Catherine Villa is a 34 y.o. G3P2002 at [redacted]w[redacted]d who presents to maternity admissions with non reassuring NST.  She was being monitored with NST in regular OB visit this afternoon for B / A1GDM. During mornitoring, she was noted to have FHR with baseline in 150s with moderate variability, + accels. She did have 1 varaible deceleration to 120 and thus was sent to MAU for further monitoring and BPP.  Denies contractions, leakage of fluid or vaginal bleeding. Good fetal movement.   Pregnancy Course:  B / A1GDM H/O C-section x 2 AMA (34 yo)  Past Medical History: Past Medical History  Diagnosis Date  . Diabetes mellitus without complication     Past obstetric history: OB History  Gravida Para Term Preterm AB SAB TAB Ectopic Multiple Living  0 0 0 0 0 0 2    # Outcome Date GA Lbr Len/2nd Weight Sex Delivery Anes PTL Lv  3 Current           2 Term 01/24/08 [redacted]w[redacted]d  8 lb 5 oz (3.771 kg) F CS-Unspec Spinal  Y  1 Term 12/10/02 [redacted]w[redacted]d  8 lb 4 oz (3.742 kg) F CS-Unspec Spinal  Y      Past Surgical History: Past Surgical History  Procedure Laterality Date  . Cesarean section       Family History: Family History  Problem Relation Age of Onset  . Hypertension Mother     Social History: History  Substance Use Topics  . Smoking status: Never Smoker   . Smokeless tobacco: Never Used  . Alcohol Use: No    Allergies: No Known Allergies  Meds:  Prescriptions prior to admission  Medication Sig Dispense Refill Last Dose  . aspirin EC 81 MG tablet Take 1 tablet (81 mg total) by mouth daily. For prevention of preeclampsia 200 tablet 2 07/16/2014 at Unknown time  . Prenatal Vit-Fe Fumarate-FA (PRENATAL MULTIVITAMIN) TABS tablet Take 1 tablet by mouth daily at 12 noon.   07/16/2014 at Unknown time    ROS:  ROS  Physical Exam  Blood pressure 103/64, pulse 76, resp.  rate 20, last menstrual period 11/23/2013. GENERAL: Well-developed, well-nourished female in no acute distress.  HEENT: normocephalic HEART: normal rate RESP: normal effort ABDOMEN: Soft, non-tender, gravid appropriate for gestational age EXTREMITIES: Nontender, no edema NEURO: alert and oriented SPECULUM EXAM: Not indicated FHT:  Baseline 135 , moderate variability, accelerations present, no decelerations  Contractions: occasional, not felt by patient   Labs: No results found for this or any previous visit (from the past 24 hour(s)).  Imaging:  US Ob Follow Up  07/12/2014   OBSTETRICAL ULTRASOUND: This exam was performed within a [facility]. The OB US report was generated in the AS system, and faxed to the ordering physician.   This report is available in the [employer]. See the AS Obstetric US report via the Image Link.  MAU Course:  Patient monitored in MAU on prolonged EFM with Cat I tracing, reassuring. Mod variability, + accels, no decels Ultrasound performed and showed BPP of 8/8.  Assessment:   ICD-9-CM ICD-10-CM   1. Antepartum multigravida of advanced maternal age, second trimester 30.63 O52.522   2. Gestational diabetes 648.80 O24.419 US Fetal BPP W/O Non Stress     US Fetal BPP W/O Non Stress  3. Preexisting diabetes complicating pregnancy, antepartum,  third trimester 648.03 O24.913    250.00    4. Previous cesarean section complicating pregnancy, antepartum condition or complication 654.23 O34.21     Plan: Discharge home Labor precautions and fetal kick counts    Medication List    ASK your doctor about these medications        aspirin EC 81 MG tablet  Take 1 tablet (81 mg total) by mouth daily. For prevention of preeclampsia     prenatal multivitamin Tabs tablet  Take 1 tablet by mouth daily at 12 noon.        Ethelda Chickaroline Malan Werk, MD 07/16/2014 6:29 PM

## 2014-07-16 NOTE — Progress Notes (Signed)
Variable deceleration seen on NST.  Referred to MAU for BPP.

## 2014-07-19 ENCOUNTER — Ambulatory Visit (INDEPENDENT_AMBULATORY_CARE_PROVIDER_SITE_OTHER): Payer: Medicaid Other | Admitting: Family Medicine

## 2014-07-19 ENCOUNTER — Encounter: Payer: Self-pay | Admitting: Obstetrics and Gynecology

## 2014-07-19 VITALS — BP 94/54 | HR 80 | Wt 141.7 lb

## 2014-07-19 DIAGNOSIS — O24313 Unspecified pre-existing diabetes mellitus in pregnancy, third trimester: Secondary | ICD-10-CM

## 2014-07-19 DIAGNOSIS — O3421 Maternal care for scar from previous cesarean delivery: Secondary | ICD-10-CM

## 2014-07-19 DIAGNOSIS — O24913 Unspecified diabetes mellitus in pregnancy, third trimester: Secondary | ICD-10-CM

## 2014-07-19 DIAGNOSIS — O34219 Maternal care for unspecified type scar from previous cesarean delivery: Secondary | ICD-10-CM

## 2014-07-19 LAB — POCT URINALYSIS DIP (DEVICE)
Bilirubin Urine: NEGATIVE
Glucose, UA: NEGATIVE mg/dL
Hgb urine dipstick: NEGATIVE
Ketones, ur: NEGATIVE mg/dL
Leukocytes, UA: NEGATIVE
Nitrite: NEGATIVE
PH: 7 (ref 5.0–8.0)
Protein, ur: NEGATIVE mg/dL
SPECIFIC GRAVITY, URINE: 1.02 (ref 1.005–1.030)
Urobilinogen, UA: 0.2 mg/dL (ref 0.0–1.0)

## 2014-07-19 NOTE — Progress Notes (Signed)
NST reactive Just one PP CBG elevated at 122. BTL papers signed with Programme researcher, broadcasting/film/videoBrooke Labor and fetal movement precautions given

## 2014-07-19 NOTE — Progress Notes (Signed)
Pt currently in the process of applying for Medicaid.  Discussed with K. Rassette, pt to sign BTL papers after medicaid approved.  Spanish interpreter [employer]quel Mora present.

## 2014-07-19 NOTE — Patient Instructions (Signed)
Informacin sobre Engineer, civil (consulting)la esterilizacin en las mujeres  (Sterilization Information, Female) La esterilizacin en la mujer es un procedimiento que se realiza para Location managerevitar el embarazo de Stapletonmanera permanente. Hay diferentes formas de Futures traderrealizar la esterilizacin, Biomedical engineerpero en todos los casos se bloquean o se cierran las trompas de [place] para que vulos no puedan llegar al tero. Si el vulo no llega al tero, los espermatozoides no fertilizan el vulo, y usted no podr Burundiquedar embarazada.  La esterilizacin se lleva a cabo por medio de un procedimiento quirrgico. A veces, estos procedimientos se realizan en el hospital haciendo dormir a Education officer, communityla paciente. En otros casos se realiza en un consultorio en una clnica y la paciente permanece despierta. Las trompas de [place]op[place] se pueden cortar, Public affairs consultantligar o sellar quirrgicamente, con un procedimiento que se llama ligadura de trompas. Otro mtodo es cerrarlas con clips o anillos. La esterilizacin tambin puede realizarse mediante la colocacin de un pequeo resorte en cada trompa de [place], lo que hace que se desarrolle tejido cicatrizal en el interior de la trompa. Luego el tejido Verizoncicatrizal obstruye las trompas.   Comente el tema con su mdico para responder las inquietudes que usted o su pareja puedan Warehouse managertener. Podr preguntarle a su mdico qu tipo de Diplomatic Services operational officeresterilizacin se realizar. Algunos profesionales pueden no realizar todas las opciones. La esterilizacin es permanente y slo debe hacerse si est segura de que no desea tener hijos o no desea tener ms hijos. La reversin de la esterilizacin puede no tener xito.  PROCEDIMIENTOS PARA LA ESTERILIZACIN   Esterilizacin laparoscpica. Es un mtodo quirrgico que se Biomedical engineerrealiza en otro momento que no es inmediatamente despus del [place] Harborparto. Se realizan dos incisiones en la zona baja del abdomen. Un tubo delgado con una fuente de luz (laparoscopio) se inserta en una de las incisiones y se [place] ([place]) Islandsutiliza para Surveyor, quantityrealizar el procedimiento. Las trompas de  [place] se cierran con un anillo o un clip. Podrn utilizar un instrumento que utiliza el calor para sellar las trompas (electrocauterizacin).  Mini-laparotoma. Es un mtodo quirrgico que se Biomedical engineerrealiza 1 o 2 das despus del [place]arto. En general, consiste en una pequea incisin que se hace justo debajo del (ombligo) por el cual se visualizan las trompas de [place]. Las trompas pueden ser selladas, atadas o cortadas.   Esterilizacin histeroscpica. Esto se realiza en otro momento que no sea inmediatamente despus del parto. Se coloca un pequeo resorte helicoidal a travs del cuello y del cuerpo del tero y se inserta en las trompas de [place]op[place]. El resorte produce cicatrizacin y Thrivent Financialobstruye las trompas. Se deben utilizar otras formas de anticoncepcin durante 3 meses despus del procedimiento para permitir que el tejido cicatrizal se forme completamente. Adems, es necesario hacer una histerosalpingografa despus de 3 meses para asegurarse de que el procedimiento ha sido exitoso.  La histerosalpingografa es un procedimiento en el que utilizan rayos X para observar el tero y las trompas de [place] despus de insertar un dispositivo, para asegurarse de que est bien colocado. LA ESTERILIZACIN ES UN PROCEDIMIENTO SEGURO?  La esterilizacin se considera un procedimiento seguro en el que rara vez se producen complicaciones. Los riesgos dependen del tipo de procedimiento que se realicen. Al igual que con cualquier procedimiento quirrgico, puede haber riesgos. Algunos son:   Heron NaySangrado.  Infeccin.  Reaccin a la anestesia.  Lesin en los rganos circundantes. Los riesgos especficos de la colocacin de los resortes por histeroscopa son:   No se Production designer, theatre/television/filmpueden colocar correctamente la primera vez.   Las resortes pueden salirse del Environmental consultantlugar.  Las trompas no quedan completamente bloqueadas despus de 3 meses.   Ocurre una lesin en los rganos adyacentes al colocarlo.  LA ESTERILIZACIN ES UN MTODO  EFECTIVO?  La esterilizacin es efectiva en casi 100% , pero puede fallar. Segn el tipo de esterilizacin, el porcentaje de fracaso puede ser tan alto como en un 3%. Despus de la esterilizacin histeroscpica con colocacin de un resorte en las trompas de [place], Pension scheme managernecesitar un mtodo anticonceptivo de respaldo durante 3 meses. La esterilizacin es efectiva durante toda la vida.  LOS BENEFICIOS DE LA ESTERILIZACIN   No afecta las hormonas, y por lo tanto no afectar sus perodos menstruales, el deseo o el rendimiento sexual, .   Es efectiva para toda la vida.   Es segura.   No tiene que preocuparse por Location managerevitar el embarazo. Tenga en cuenta que si fue sometida a este procedimiento, debe esperar 3 meses (o hasta que el mdico lo confirme) antes de considerar que no quedar embarazada.   No hay efectos secundarios a diferencia de otros tipos de control de la natalidad (contracepcin).  INCONVENIENTES DE LA ESTERILIZACIN   Usted debe estar seguro de que no desea tener hijos o ms hijos. El procedimiento es La Habrapermanente.   No ofrece proteccin contra las infecciones de transmisin sexual (ITS).   Las trompas Hess Corporationpueden volver a Engineer, building servicesdesarrollarse. Si esto ocurre, habr riesgo de embarazo. Tambin hay un mayor riesgo (50%) que el embarazo sea ectpico. Se llama as al embarazo que ocurre fuera del tero. Document Released: 12/02/2007 Document Revised: 06/20/2013 [facility], [facility], [facility] Patient Information 2015 [place]Care, [place]. This information is not intended to replace advice given to you by your health care provider. Make sure you discuss any questions you have with your health care provider.

## 2014-07-19 NOTE — Progress Notes (Signed)
Dori used for interpreter 

## 2014-07-23 ENCOUNTER — Other Ambulatory Visit: Payer: Self-pay

## 2014-07-26 ENCOUNTER — Ambulatory Visit (INDEPENDENT_AMBULATORY_CARE_PROVIDER_SITE_OTHER): Payer: Medicaid Other | Admitting: Obstetrics & Gynecology

## 2014-07-26 VITALS — BP 95/57 | HR 77 | Temp 97.7°F | Wt 142.0 lb

## 2014-07-26 DIAGNOSIS — O24313 Unspecified pre-existing diabetes mellitus in pregnancy, third trimester: Secondary | ICD-10-CM

## 2014-07-26 DIAGNOSIS — O24913 Unspecified diabetes mellitus in pregnancy, third trimester: Secondary | ICD-10-CM

## 2014-07-26 DIAGNOSIS — O24312 Unspecified pre-existing diabetes mellitus in pregnancy, second trimester: Secondary | ICD-10-CM

## 2014-07-26 DIAGNOSIS — O3421 Maternal care for scar from previous cesarean delivery: Secondary | ICD-10-CM

## 2014-07-26 DIAGNOSIS — O34219 Maternal care for unspecified type scar from previous cesarean delivery: Secondary | ICD-10-CM

## 2014-07-26 LAB — US OB FOLLOW UP

## 2014-07-26 LAB — POCT URINALYSIS DIP (DEVICE)
Bilirubin Urine: NEGATIVE
Glucose, UA: NEGATIVE mg/dL
Ketones, ur: NEGATIVE mg/dL
Leukocytes, UA: NEGATIVE
Nitrite: NEGATIVE
PROTEIN: NEGATIVE mg/dL
Specific Gravity, Urine: 1.015 (ref 1.005–1.030)
Urobilinogen, UA: 0.2 mg/dL (ref 0.0–1.0)
pH: 6.5 (ref 5.0–8.0)

## 2014-07-26 NOTE — Progress Notes (Signed)
Catherine Villa Spanish Interpreter present for encounter

## 2014-07-26 NOTE — Progress Notes (Signed)
Records requested from office of Dr. Elizebeth Brookingotton for fetal echo.  [facility]  Both to be faxed to [phone].

## 2014-07-26 NOTE — Progress Notes (Signed)
Not checking fasting or pp breakfast  Three elevated pp for lunch and dinner (126 & 136).  Will start checking every other day fastings and pp breakfast.  Then next day check pp lunch and dinner. Still need optho note.  Fetal echo is here and will be scanned to media. last growth nml (Jan 2016); continue 2x week testing with planned rpt c/s.

## 2014-07-30 ENCOUNTER — Ambulatory Visit (INDEPENDENT_AMBULATORY_CARE_PROVIDER_SITE_OTHER): Payer: Medicaid Other | Admitting: *Deleted

## 2014-07-30 VITALS — BP 104/57 | HR 92 | Wt 142.2 lb

## 2014-07-30 DIAGNOSIS — O24913 Unspecified diabetes mellitus in pregnancy, third trimester: Secondary | ICD-10-CM

## 2014-07-30 DIAGNOSIS — O24313 Unspecified pre-existing diabetes mellitus in pregnancy, third trimester: Secondary | ICD-10-CM

## 2014-07-30 NOTE — Progress Notes (Signed)
Used Interpreter Kohl'sJulie Sowell.

## 2014-07-31 NOTE — Progress Notes (Signed)
07/30/2014 NST reviewed and reactive 

## 2014-08-02 ENCOUNTER — Ambulatory Visit (INDEPENDENT_AMBULATORY_CARE_PROVIDER_SITE_OTHER): Payer: Medicaid Other | Admitting: Obstetrics & Gynecology

## 2014-08-02 ENCOUNTER — Encounter: Payer: Self-pay | Admitting: Obstetrics & Gynecology

## 2014-08-02 ENCOUNTER — Other Ambulatory Visit: Payer: Self-pay | Admitting: Obstetrics & Gynecology

## 2014-08-02 VITALS — BP 91/55 | HR 79 | Wt 144.7 lb

## 2014-08-02 DIAGNOSIS — Z3493 Encounter for supervision of normal pregnancy, unspecified, third trimester: Secondary | ICD-10-CM

## 2014-08-02 DIAGNOSIS — O24313 Unspecified pre-existing diabetes mellitus in pregnancy, third trimester: Secondary | ICD-10-CM | POA: Diagnosis present

## 2014-08-02 DIAGNOSIS — O3421 Maternal care for scar from previous cesarean delivery: Secondary | ICD-10-CM

## 2014-08-02 DIAGNOSIS — O34219 Maternal care for unspecified type scar from previous cesarean delivery: Secondary | ICD-10-CM

## 2014-08-02 DIAGNOSIS — O24913 Unspecified diabetes mellitus in pregnancy, third trimester: Secondary | ICD-10-CM

## 2014-08-02 LAB — POCT URINALYSIS DIP (DEVICE)
BILIRUBIN URINE: NEGATIVE
GLUCOSE, UA: NEGATIVE mg/dL
Ketones, ur: NEGATIVE mg/dL
LEUKOCYTES UA: NEGATIVE
NITRITE: NEGATIVE
PROTEIN: NEGATIVE mg/dL
Specific Gravity, Urine: 1.01 (ref 1.005–1.030)
UROBILINOGEN UA: 0.2 mg/dL (ref 0.0–1.0)
pH: 7.5 (ref 5.0–8.0)

## 2014-08-02 LAB — US OB FOLLOW UP

## 2014-08-02 NOTE — Progress Notes (Signed)
NST reactive, AFI 17. Repeat CS 08/23/14, wants BTL. BG allin range except 1. GC CT GBS done

## 2014-08-02 NOTE — Progress Notes (Signed)
NST/AFI/OBF Luther RedoAdriana Zavala-Martinez Spanish Interpreter for encounter. 36wk Cultures

## 2014-08-02 NOTE — Progress Notes (Signed)
Followup U/S 08/09/14 @ 830a with Radiology.

## 2014-08-02 NOTE — Patient Instructions (Signed)
Tercer trimestre de embarazo (Third Trimester of Pregnancy) El tercer trimestre va desde la semana29 hasta la 42, desde el sptimo hasta el noveno mes, y es la poca en la que el feto crece ms rpidamente. Hacia el final del noveno mes, el feto mide alrededor de 20pulgadas (45cm) de largo y pesa entre 6 y 10 libras (2,700 y 4,500kg).  CAMBIOS EN EL ORGANISMO Su organismo atraviesa por muchos cambios durante el embarazo, y estos varan de una mujer a otra.   Seguir aumentando de peso. Es de esperar que aumente entre 25 y 35libras (11 y 16kg) hacia el final del embarazo.  Podrn aparecer las primeras estras en las caderas, el abdomen y las mamas.  Puede tener necesidad de orinar con ms frecuencia porque el feto baja hacia la pelvis y ejerce presin sobre la vejiga.  Debido al embarazo podr sentir acidez estomacal con frecuencia.  Puede estar estreida, ya que ciertas hormonas enlentecen los movimientos de los msculos que empujan los desechos a travs de los intestinos.  Pueden aparecer hemorroides o abultarse e hincharse las venas (venas varicosas).  Puede sentir dolor plvico debido al aumento de peso y a que las hormonas del embarazo relajan las articulaciones entre los huesos de la pelvis. El dolor de espalda puede ser consecuencia de la sobrecarga de los msculos que soportan la postura.  Tal vez haya cambios en el cabello que pueden incluir su engrosamiento, crecimiento rpido y cambios en la textura. Adems, a algunas mujeres se les cae el cabello durante o despus del embarazo, o tienen el cabello seco o fino. Lo ms probable es que el cabello se le normalice despus del nacimiento del beb.  Las mamas seguirn creciendo y le dolern. A veces, puede haber una secrecin amarilla de las mamas llamada calostro.  El ombligo puede salir hacia afuera.  Puede sentir que le falta el aire debido a que se expande el tero.  Puede notar que el feto "baja" o lo siente ms bajo, en el  abdomen.  Puede tener una prdida de secrecin mucosa con sangre. Esto suele ocurrir en el trmino de unos pocos das a una semana antes de que comience el trabajo de parto.  El cuello del tero se vuelve delgado y blando (se borra) cerca de la fecha de parto. QU DEBE ESPERAR EN LOS EXMENES PRENATALES  Le harn exmenes prenatales cada 2semanas hasta la semana36. A partir de ese momento le harn exmenes semanales. Durante una visita prenatal de rutina:  La pesarn para asegurarse de que usted y el feto estn creciendo normalmente.  Le tomarn la presin arterial.  Le medirn el abdomen para controlar el desarrollo del beb.  Se escucharn los latidos cardacos fetales.  Se evaluarn los resultados de los estudios solicitados en visitas anteriores.  Le revisarn el cuello del tero cuando est prxima la fecha de parto para controlar si este se ha borrado. Alrededor de la semana36, el mdico le revisar el cuello del tero. Al mismo tiempo, realizar un anlisis de las secreciones del tejido vaginal. Este examen es para determinar si hay un tipo de bacteria, estreptococo Grupo B. El mdico le explicar esto con ms detalle. El mdico puede preguntarle lo siguiente:  Cmo le gustara que fuera el parto.  Cmo se siente.  Si siente los movimientos del beb.  Si ha tenido sntomas anormales, como prdida de lquido, sangrado, dolores de cabeza intensos o clicos abdominales.  Si tiene alguna pregunta. Otros exmenes o estudios de deteccin que pueden realizarse   durante el tercer trimestre incluyen lo siguiente:  Anlisis de sangre para controlar las concentraciones de hierro (anemia).  Controles fetales para determinar su salud, nivel de actividad y crecimiento. Si tiene alguna enfermedad o hay problemas durante el embarazo, le harn estudios. FALSO TRABAJO DE PARTO Es posible que sienta contracciones leves e irregulares que finalmente desaparecen. Se llaman contracciones de  Braxton Hicks o falso trabajo de parto. Las contracciones pueden durar horas, das o incluso semanas, antes de que el verdadero trabajo de parto se inicie. Si las contracciones ocurren a intervalos regulares, se intensifican o se hacen dolorosas, lo mejor es que la revise el mdico.  SIGNOS DE TRABAJO DE PARTO   Clicos de tipo menstrual.  Contracciones cada 5minutos o menos.  Contracciones que comienzan en la parte superior del tero y se extienden hacia abajo, a la zona inferior del abdomen y la espalda.  Sensacin de mayor presin en la pelvis o dolor de espalda.  Una secrecin de mucosidad acuosa o con sangre que sale de la vagina. Si tiene alguno de estos signos antes de la semana37 del embarazo, llame a su mdico de inmediato. Debe concurrir al hospital para que la controlen inmediatamente. INSTRUCCIONES PARA EL CUIDADO EN EL HOGAR   Evite fumar, consumir hierbas, beber alcohol y tomar frmacos que no le hayan recetado. Estas sustancias qumicas afectan la formacin y el desarrollo del beb.  Siga las indicaciones del mdico en relacin con el uso de medicamentos. Durante el embarazo, hay medicamentos que son seguros de tomar y otros que no.  Haga actividad fsica solo en la forma indicada por el mdico. Sentir clicos uterinos es un buen signo para detener la actividad fsica.  Contine comiendo alimentos que sanos con regularidad.  Use un sostn que le brinde buen soporte si le duelen las mamas.  No se d baos de inmersin en agua caliente, baos turcos ni saunas.  Colquese el cinturn de seguridad cuando conduzca.  No coma carne cruda ni queso sin cocinar; evite el contacto con las bandejas sanitarias de los gatos y la tierra que estos animales usan. Estos elementos contienen grmenes que pueden causar defectos congnitos en el beb.  Tome las vitaminas prenatales.  Si est estreida, pruebe un laxante suave (si el mdico lo autoriza). Consuma ms alimentos ricos en  fibra, como vegetales y frutas frescos y cereales integrales. Beba gran cantidad de lquido para mantener la orina de tono claro o color amarillo plido.  Dese baos de asiento con agua tibia para aliviar el dolor o las molestias causadas por las hemorroides. Use una crema para las hemorroides si el mdico la autoriza.  Si tiene venas varicosas, use medias de descanso. Eleve los pies durante 15minutos, 3 o 4veces por da. Limite la cantidad de sal en su dieta.  Evite levantar objetos pesados, use zapatos de tacones bajos y mantenga una buena postura.  Descanse con las piernas elevadas si tiene calambres o dolor de cintura.  Visite a su dentista si no lo ha hecho durante el embarazo. Use un cepillo de dientes blando para higienizarse los dientes y psese el hilo dental con suavidad.  Puede seguir manteniendo relaciones sexuales, a menos que el mdico le indique lo contrario.  No haga viajes largos excepto que sea absolutamente necesario y solo con la autorizacin del mdico.  Tome clases prenatales para entender, practicar y hacer preguntas sobre el trabajo de parto y el parto.  Haga un ensayo de la partida al hospital.  Prepare el bolso que   llevar al hospital.  Prepare la habitacin del beb.  Concurra a todas las visitas prenatales segn las indicaciones de su mdico. SOLICITE ATENCIN MDICA SI:  No est segura de que est en trabajo de parto o de que ha roto la bolsa de las aguas.  Tiene mareos.  Siente clicos leves, presin en la pelvis o dolor persistente en el abdomen.  Tiene nuseas, vmitos o diarrea persistentes.  Tiene secrecin vaginal con mal olor.  Siente dolor al orinar. SOLICITE ATENCIN MDICA DE INMEDIATO SI:   Tiene fiebre.  Tiene una prdida de lquido por la vagina.  Tiene sangrado o pequeas prdidas vaginales.  Siente dolor intenso o clicos en el abdomen.  Sube o baja de peso rpidamente.  Tiene dificultad para respirar y siente dolor de  pecho.  Sbitamente se le hinchan mucho el rostro, las manos, los tobillos, los pies o las piernas.  No ha sentido los movimientos del beb durante una hora.  Siente un dolor de cabeza intenso que no se alivia con medicamentos.  Hay cambios en la visin. Document Released: 03/25/2005 Document Revised: 06/20/2013 [employer] Patient Information 2015 [employer]. This information is not intended to replace advice given to you by your health care provider. Make sure you discuss any questions you have with your health care provider.  

## 2014-08-03 LAB — GC/CHLAMYDIA PROBE AMP
CT PROBE, AMP APTIMA: NEGATIVE
GC Probe RNA: NEGATIVE

## 2014-08-04 LAB — CULTURE, BETA STREP (GROUP B ONLY)

## 2014-08-07 ENCOUNTER — Ambulatory Visit (INDEPENDENT_AMBULATORY_CARE_PROVIDER_SITE_OTHER): Payer: Medicaid Other | Admitting: *Deleted

## 2014-08-07 VITALS — BP 95/60 | HR 75

## 2014-08-07 DIAGNOSIS — O24913 Unspecified diabetes mellitus in pregnancy, third trimester: Secondary | ICD-10-CM

## 2014-08-07 DIAGNOSIS — O24313 Unspecified pre-existing diabetes mellitus in pregnancy, third trimester: Secondary | ICD-10-CM

## 2014-08-09 ENCOUNTER — Ambulatory Visit (INDEPENDENT_AMBULATORY_CARE_PROVIDER_SITE_OTHER): Payer: Medicaid Other | Admitting: Obstetrics & Gynecology

## 2014-08-09 ENCOUNTER — Other Ambulatory Visit: Payer: Self-pay

## 2014-08-09 ENCOUNTER — Ambulatory Visit (HOSPITAL_COMMUNITY)
Admission: RE | Admit: 2014-08-09 | Discharge: 2014-08-09 | Disposition: A | Discharge status: Home / Self Care | Payer: MEDICAID | Source: Ambulatory Visit | Attending: Obstetrics & Gynecology | Admitting: Obstetrics & Gynecology

## 2014-08-09 VITALS — BP 90/66 | HR 82 | Wt 143.4 lb

## 2014-08-09 DIAGNOSIS — O24313 Unspecified pre-existing diabetes mellitus in pregnancy, third trimester: Secondary | ICD-10-CM

## 2014-08-09 LAB — POCT URINALYSIS DIP (DEVICE)
BILIRUBIN URINE: NEGATIVE
Glucose, UA: NEGATIVE mg/dL
Hgb urine dipstick: NEGATIVE
Ketones, ur: NEGATIVE mg/dL
LEUKOCYTES UA: NEGATIVE
NITRITE: NEGATIVE
PH: 6.5 (ref 5.0–8.0)
PROTEIN: 30 mg/dL — AB
Specific Gravity, Urine: 1.02 (ref 1.005–1.030)
UROBILINOGEN UA: 1 mg/dL (ref 0.0–1.0)

## 2014-08-09 NOTE — Progress Notes (Signed)
Dori used for interpreter; Patient didn't make this morning's ultrasound appt- she was not aware.

## 2014-08-09 NOTE — Patient Instructions (Signed)
Tercer trimestre de embarazo (Third Trimester of Pregnancy) El tercer trimestre va desde la semana29 hasta la 42, desde el sptimo hasta el noveno mes, y es la poca en la que el feto crece ms rpidamente. Hacia el final del noveno mes, el feto mide alrededor de 20pulgadas (45cm) de largo y pesa entre 6 y 10 libras (2,700 y 4,500kg).  CAMBIOS EN EL ORGANISMO Su organismo atraviesa por muchos cambios durante el embarazo, y estos varan de una mujer a otra.   Seguir aumentando de peso. Es de esperar que aumente entre 25 y 35libras (11 y 16kg) hacia el final del embarazo.  Podrn aparecer las primeras estras en las caderas, el abdomen y las mamas.  Puede tener necesidad de orinar con ms frecuencia porque el feto baja hacia la pelvis y ejerce presin sobre la vejiga.  Debido al embarazo podr sentir acidez estomacal con frecuencia.  Puede estar estreida, ya que ciertas hormonas enlentecen los movimientos de los msculos que empujan los desechos a travs de los intestinos.  Pueden aparecer hemorroides o abultarse e hincharse las venas (venas varicosas).  Puede sentir dolor plvico debido al aumento de peso y a que las hormonas del embarazo relajan las articulaciones entre los huesos de la pelvis. El dolor de espalda puede ser consecuencia de la sobrecarga de los msculos que soportan la postura.  Tal vez haya cambios en el cabello que pueden incluir su engrosamiento, crecimiento rpido y cambios en la textura. Adems, a algunas mujeres se les cae el cabello durante o despus del embarazo, o tienen el cabello seco o fino. Lo ms probable es que el cabello se le normalice despus del nacimiento del beb.  Las mamas seguirn creciendo y le dolern. A veces, puede haber una secrecin amarilla de las mamas llamada calostro.  El ombligo puede salir hacia afuera.  Puede sentir que le falta el aire debido a que se expande el tero.  Puede notar que el feto "baja" o lo siente ms bajo, en el  abdomen.  Puede tener una prdida de secrecin mucosa con sangre. Esto suele ocurrir en el trmino de unos pocos das a una semana antes de que comience el trabajo de parto.  El cuello del tero se vuelve delgado y blando (se borra) cerca de la fecha de parto. QU DEBE ESPERAR EN LOS EXMENES PRENATALES  Le harn exmenes prenatales cada 2semanas hasta la semana36. A partir de ese momento le harn exmenes semanales. Durante una visita prenatal de rutina:  La pesarn para asegurarse de que usted y el feto estn creciendo normalmente.  Le tomarn la presin arterial.  Le medirn el abdomen para controlar el desarrollo del beb.  Se escucharn los latidos cardacos fetales.  Se evaluarn los resultados de los estudios solicitados en visitas anteriores.  Le revisarn el cuello del tero cuando est prxima la fecha de parto para controlar si este se ha borrado. Alrededor de la semana36, el mdico le revisar el cuello del tero. Al mismo tiempo, realizar un anlisis de las secreciones del tejido vaginal. Este examen es para determinar si hay un tipo de bacteria, estreptococo Grupo B. El mdico le explicar esto con ms detalle. El mdico puede preguntarle lo siguiente:  Cmo le gustara que fuera el parto.  Cmo se siente.  Si siente los movimientos del beb.  Si ha tenido sntomas anormales, como prdida de lquido, sangrado, dolores de cabeza intensos o clicos abdominales.  Si tiene alguna pregunta. Otros exmenes o estudios de deteccin que pueden realizarse   durante el tercer trimestre incluyen lo siguiente:  Anlisis de sangre para controlar las concentraciones de hierro (anemia).  Controles fetales para determinar su salud, nivel de actividad y crecimiento. Si tiene alguna enfermedad o hay problemas durante el embarazo, le harn estudios. FALSO TRABAJO DE PARTO Es posible que sienta contracciones leves e irregulares que finalmente desaparecen. Se llaman contracciones de  Braxton Hicks o falso trabajo de parto. Las contracciones pueden durar horas, das o incluso semanas, antes de que el verdadero trabajo de parto se inicie. Si las contracciones ocurren a intervalos regulares, se intensifican o se hacen dolorosas, lo mejor es que la revise el mdico.  SIGNOS DE TRABAJO DE PARTO   Clicos de tipo menstrual.  Contracciones cada 5minutos o menos.  Contracciones que comienzan en la parte superior del tero y se extienden hacia abajo, a la zona inferior del abdomen y la espalda.  Sensacin de mayor presin en la pelvis o dolor de espalda.  Una secrecin de mucosidad acuosa o con sangre que sale de la vagina. Si tiene alguno de estos signos antes de la semana37 del embarazo, llame a su mdico de inmediato. Debe concurrir al hospital para que la controlen inmediatamente. INSTRUCCIONES PARA EL CUIDADO EN EL HOGAR   Evite fumar, consumir hierbas, beber alcohol y tomar frmacos que no le hayan recetado. Estas sustancias qumicas afectan la formacin y el desarrollo del beb.  Siga las indicaciones del mdico en relacin con el uso de medicamentos. Durante el embarazo, hay medicamentos que son seguros de tomar y otros que no.  Haga actividad fsica solo en la forma indicada por el mdico. Sentir clicos uterinos es un buen signo para detener la actividad fsica.  Contine comiendo alimentos que sanos con regularidad.  Use un sostn que le brinde buen soporte si le duelen las mamas.  No se d baos de inmersin en agua caliente, baos turcos ni saunas.  Colquese el cinturn de seguridad cuando conduzca.  No coma carne cruda ni queso sin cocinar; evite el contacto con las bandejas sanitarias de los gatos y la tierra que estos animales usan. Estos elementos contienen grmenes que pueden causar defectos congnitos en el beb.  Tome las vitaminas prenatales.  Si est estreida, pruebe un laxante suave (si el mdico lo autoriza). Consuma ms alimentos ricos en  fibra, como vegetales y frutas frescos y cereales integrales. Beba gran cantidad de lquido para mantener la orina de tono claro o color amarillo plido.  Dese baos de asiento con agua tibia para aliviar el dolor o las molestias causadas por las hemorroides. Use una crema para las hemorroides si el mdico la autoriza.  Si tiene venas varicosas, use medias de descanso. Eleve los pies durante 15minutos, 3 o 4veces por da. Limite la cantidad de sal en su dieta.  Evite levantar objetos pesados, use zapatos de tacones bajos y mantenga una buena postura.  Descanse con las piernas elevadas si tiene calambres o dolor de cintura.  Visite a su dentista si no lo ha hecho durante el embarazo. Use un cepillo de dientes blando para higienizarse los dientes y psese el hilo dental con suavidad.  Puede seguir manteniendo relaciones sexuales, a menos que el mdico le indique lo contrario.  No haga viajes largos excepto que sea absolutamente necesario y solo con la autorizacin del mdico.  Tome clases prenatales para entender, practicar y hacer preguntas sobre el trabajo de parto y el parto.  Haga un ensayo de la partida al hospital.  Prepare el bolso que   llevar al hospital.  Prepare la habitacin del beb.  Concurra a todas las visitas prenatales segn las indicaciones de su mdico. SOLICITE ATENCIN MDICA SI:  No est segura de que est en trabajo de parto o de que ha roto la bolsa de las aguas.  Tiene mareos.  Siente clicos leves, presin en la pelvis o dolor persistente en el abdomen.  Tiene nuseas, vmitos o diarrea persistentes.  Tiene secrecin vaginal con mal olor.  Siente dolor al orinar. SOLICITE ATENCIN MDICA DE INMEDIATO SI:   Tiene fiebre.  Tiene una prdida de lquido por la vagina.  Tiene sangrado o pequeas prdidas vaginales.  Siente dolor intenso o clicos en el abdomen.  Sube o baja de peso rpidamente.  Tiene dificultad para respirar y siente dolor de  pecho.  Sbitamente se le hinchan mucho el rostro, las manos, los tobillos, los pies o las piernas.  No ha sentido los movimientos del beb durante una hora.  Siente un dolor de cabeza intenso que no se alivia con medicamentos.  Hay cambios en la visin. Document Released: 03/25/2005 Document Revised: 06/20/2013 [employer] Patient Information 2015 [employer]. This information is not intended to replace advice given to you by your health care provider. Make sure you discuss any questions you have with your health care provider.  

## 2014-08-09 NOTE — Progress Notes (Signed)
NST reactive, c/s on 2/26 is scheduled. BG in range./s for growth, will not r/s

## 2014-08-10 NOTE — Progress Notes (Signed)
NST reactive.

## 2014-08-14 ENCOUNTER — Ambulatory Visit (INDEPENDENT_AMBULATORY_CARE_PROVIDER_SITE_OTHER): Payer: Medicaid Other | Admitting: *Deleted

## 2014-08-14 VITALS — BP 98/63 | HR 77

## 2014-08-14 DIAGNOSIS — O24313 Unspecified pre-existing diabetes mellitus in pregnancy, third trimester: Secondary | ICD-10-CM

## 2014-08-14 DIAGNOSIS — O24913 Unspecified diabetes mellitus in pregnancy, third trimester: Secondary | ICD-10-CM

## 2014-08-14 LAB — US OB FOLLOW UP

## 2014-08-14 NOTE — Progress Notes (Signed)
NST reactive.

## 2014-08-17 ENCOUNTER — Ambulatory Visit (INDEPENDENT_AMBULATORY_CARE_PROVIDER_SITE_OTHER): Payer: Medicaid Other | Admitting: Family Medicine

## 2014-08-17 VITALS — BP 97/62 | HR 82 | Wt 144.0 lb

## 2014-08-17 DIAGNOSIS — O09522 Supervision of elderly multigravida, second trimester: Secondary | ICD-10-CM

## 2014-08-17 DIAGNOSIS — O24913 Unspecified diabetes mellitus in pregnancy, third trimester: Secondary | ICD-10-CM

## 2014-08-17 DIAGNOSIS — O3421 Maternal care for scar from previous cesarean delivery: Secondary | ICD-10-CM

## 2014-08-17 DIAGNOSIS — O24313 Unspecified pre-existing diabetes mellitus in pregnancy, third trimester: Secondary | ICD-10-CM

## 2014-08-17 DIAGNOSIS — O34219 Maternal care for unspecified type scar from previous cesarean delivery: Secondary | ICD-10-CM

## 2014-08-17 NOTE — Progress Notes (Signed)
NST reactive CBGs - two PP after dinner elevated.  All others normal. RLTCS and BTL scheduled for next Friday. No other concerns.

## 2014-08-17 NOTE — Progress Notes (Signed)
Rpt C/S scheduled 2/26

## 2014-08-21 ENCOUNTER — Encounter: Payer: Self-pay | Admitting: *Deleted

## 2014-08-21 ENCOUNTER — Ambulatory Visit (INDEPENDENT_AMBULATORY_CARE_PROVIDER_SITE_OTHER): Payer: Medicaid Other | Admitting: *Deleted

## 2014-08-21 DIAGNOSIS — O24313 Unspecified pre-existing diabetes mellitus in pregnancy, third trimester: Secondary | ICD-10-CM

## 2014-08-21 DIAGNOSIS — O24913 Unspecified diabetes mellitus in pregnancy, third trimester: Secondary | ICD-10-CM

## 2014-08-21 LAB — US OB FOLLOW UP

## 2014-08-21 NOTE — Progress Notes (Signed)
C/S scheduled on 2/26

## 2014-08-22 ENCOUNTER — Encounter: Payer: Self-pay | Admitting: Obstetrics & Gynecology

## 2014-08-22 ENCOUNTER — Encounter (HOSPITAL_COMMUNITY): Payer: Self-pay

## 2014-08-22 NOTE — Patient Instructions (Addendum)
   Your procedure is scheduled on:  Friday, Feb 26  [facility] at: 7:30 AM Pick up the phone at the desk and dial [phone] and inform us of your arrival.  Please call this number if you have any problems the morning of surgery: [phone]  Remember: Do not eat or drink after midnight: Thursday Take these medicines the morning of surgery with a SIP OF WATER:  None  Do not wear jewelry, make-up, or FINGER nail polish No metal in your hair or on your body. Do not wear lotions, powders, perfumes.  You may wear deodorant.  Do not bring valuables to the hospital. Contacts, dentures or bridgework may not be worn into surgery.  Leave suitcase in the car. After Surgery it may be brought to your room. For patients being admitted to the hospital, checkout time is 11:00am the day of discharge.  Home with Sharrell Kusaias Mendez  cell [phone]

## 2014-08-23 ENCOUNTER — Encounter (HOSPITAL_COMMUNITY): Payer: Self-pay

## 2014-08-23 ENCOUNTER — Encounter (HOSPITAL_COMMUNITY)
Admission: RE | Admit: 2014-08-23 | Discharge: 2014-08-23 | Disposition: A | Discharge status: Home / Self Care | Payer: Medicaid Other | Source: Ambulatory Visit | Attending: Family Medicine | Admitting: Family Medicine

## 2014-08-23 VITALS — BP 99/71 | HR 81 | Resp 18 | Ht 61.0 in | Wt 147.0 lb

## 2014-08-23 DIAGNOSIS — O34219 Maternal care for unspecified type scar from previous cesarean delivery: Secondary | ICD-10-CM

## 2014-08-23 LAB — CBC
HCT: 36.8 % (ref 36.0–46.0)
Hemoglobin: 12.1 g/dL (ref 12.0–15.0)
MCH: 28.5 pg (ref 26.0–34.0)
MCHC: 32.9 g/dL (ref 30.0–36.0)
MCV: 86.6 fL (ref 78.0–100.0)
Platelets: 191 10*3/uL (ref 150–400)
RBC: 4.25 MIL/uL (ref 3.87–5.11)
RDW: 14.4 % (ref 11.5–15.5)
WBC: 10.5 10*3/uL (ref 4.0–10.5)

## 2014-08-23 LAB — BASIC METABOLIC PANEL
ANION GAP: 3 — AB (ref 5–15)
BUN: 8 mg/dL (ref 6–23)
CHLORIDE: 108 mmol/L (ref 96–112)
CO2: 24 mmol/L (ref 19–32)
Calcium: 8.4 mg/dL (ref 8.4–10.5)
Creatinine, Ser: 0.51 mg/dL (ref 0.50–1.10)
GFR calc Af Amer: >90 mL/min (ref >90)
GFR calc non Af Amer: >90 mL/min (ref >90)
GLUCOSE: 74 mg/dL (ref 70–99)
POTASSIUM: 3.9 mmol/L (ref 3.5–5.1)
Sodium: 135 mmol/L (ref 135–145)

## 2014-08-23 LAB — TYPE AND SCREEN
ABO/RH(D): O POS
ANTIBODY SCREEN: NEGATIVE

## 2014-08-23 LAB — ABO/RH: ABO/RH(D): O POS

## 2014-08-23 MED ORDER — CEFAZOLIN SODIUM-DEXTROSE 2-3 GM-% IV SOLR
2.0000 g | INTRAVENOUS | Status: AC
  Administered 2014-08-24: 2 g via INTRAVENOUS

## 2014-08-23 NOTE — Pre-Procedure Instructions (Signed)
Used Eda Royal, Spanish Interpreter for PAT appointment. 

## 2014-08-23 NOTE — Progress Notes (Signed)
NST reactive.

## 2014-08-24 ENCOUNTER — Encounter (HOSPITAL_COMMUNITY): Payer: Self-pay | Admitting: Certified Registered Nurse Anesthetist

## 2014-08-24 ENCOUNTER — Inpatient Hospital Stay (HOSPITAL_COMMUNITY)
Admission: RE | Admit: 2014-08-24 | Discharge: 2014-08-26 | DRG: 765 | Disposition: A | Discharge status: Home / Self Care | Payer: Medicaid Other | Source: Ambulatory Visit | Attending: Family Medicine | Admitting: Family Medicine

## 2014-08-24 ENCOUNTER — Inpatient Hospital Stay (HOSPITAL_COMMUNITY): Payer: Medicaid Other | Admitting: Anesthesiology

## 2014-08-24 ENCOUNTER — Encounter (HOSPITAL_COMMUNITY)
Admission: RE | Disposition: A | Discharge status: Home / Self Care | Payer: Self-pay | Source: Ambulatory Visit | Attending: Family Medicine

## 2014-08-24 DIAGNOSIS — Z302 Encounter for sterilization: Secondary | ICD-10-CM

## 2014-08-24 DIAGNOSIS — O09523 Supervision of elderly multigravida, third trimester: Secondary | ICD-10-CM

## 2014-08-24 DIAGNOSIS — Z8249 Family history of ischemic heart disease and other diseases of the circulatory system: Secondary | ICD-10-CM | POA: Diagnosis not present

## 2014-08-24 DIAGNOSIS — O3421 Maternal care for scar from previous cesarean delivery: Principal | ICD-10-CM | POA: Diagnosis present

## 2014-08-24 DIAGNOSIS — O34219 Maternal care for unspecified type scar from previous cesarean delivery: Secondary | ICD-10-CM

## 2014-08-24 DIAGNOSIS — O2412 Pre-existing diabetes mellitus, type 2, in childbirth: Secondary | ICD-10-CM | POA: Diagnosis present

## 2014-08-24 DIAGNOSIS — E119 Type 2 diabetes mellitus without complications: Secondary | ICD-10-CM | POA: Diagnosis present

## 2014-08-24 DIAGNOSIS — Z98891 History of uterine scar from previous surgery: Secondary | ICD-10-CM

## 2014-08-24 DIAGNOSIS — Z3A39 39 weeks gestation of pregnancy: Secondary | ICD-10-CM | POA: Diagnosis present

## 2014-08-24 HISTORY — PX: TUBAL LIGATION: SHX77

## 2014-08-24 LAB — GLUCOSE, CAPILLARY
GLUCOSE-CAPILLARY: 81 mg/dL (ref 70–99)
Glucose-Capillary: 69 mg/dL — ABNORMAL LOW (ref 70–99)

## 2014-08-24 LAB — RPR: RPR Ser Ql: NONREACTIVE

## 2014-08-24 SURGERY — Surgical Case
Anesthesia: Spinal | Site: Abdomen

## 2014-08-24 MED ORDER — SCOPOLAMINE 1 MG/3DAYS TD PT72: 1.0000 | MEDICATED_PATCH | Freq: Once | TRANSDERMAL | Status: DC

## 2014-08-24 MED ORDER — OXYTOCIN 10 UNIT/ML IJ SOLN
INTRAMUSCULAR | Status: AC
  Filled 2014-08-24: qty 4

## 2014-08-24 MED ORDER — BUPIVACAINE HCL (PF) 0.25 % IJ SOLN
INTRAMUSCULAR | Status: AC
  Filled 2014-08-24: qty 30

## 2014-08-24 MED ORDER — ONDANSETRON HCL 4 MG/2ML IJ SOLN
INTRAMUSCULAR | Status: DC | PRN
  Administered 2014-08-24: 4 mg via INTRAVENOUS

## 2014-08-24 MED ORDER — FENTANYL CITRATE 0.05 MG/ML IJ SOLN
INTRAMUSCULAR | Status: AC
  Filled 2014-08-24: qty 2

## 2014-08-24 MED ORDER — SIMETHICONE 80 MG PO CHEW: 80.0000 mg | CHEWABLE_TABLET | ORAL | Status: DC | PRN

## 2014-08-24 MED ORDER — OXYTOCIN 40 UNITS IN LACTATED RINGERS INFUSION - SIMPLE MED: 62.5000 mL/h | INTRAVENOUS | Status: AC

## 2014-08-24 MED ORDER — LACTATED RINGERS IV SOLN
INTRAVENOUS | Status: DC | PRN
  Administered 2014-08-24: 10:00:00 via INTRAVENOUS

## 2014-08-24 MED ORDER — NALOXONE HCL 1 MG/ML IJ SOLN
1.0000 ug/kg/h | INTRAVENOUS | Status: DC | PRN
  Filled 2014-08-24: qty 2

## 2014-08-24 MED ORDER — NALBUPHINE HCL 10 MG/ML IJ SOLN: 5.0000 mg | INTRAMUSCULAR | Status: DC | PRN

## 2014-08-24 MED ORDER — ONDANSETRON HCL 4 MG PO TABS: 4.0000 mg | ORAL_TABLET | ORAL | Status: DC | PRN

## 2014-08-24 MED ORDER — DIPHENHYDRAMINE HCL 25 MG PO CAPS
25.0000 mg | ORAL_CAPSULE | ORAL | Status: DC | PRN
  Filled 2014-08-24: qty 1

## 2014-08-24 MED ORDER — MEPERIDINE HCL 25 MG/ML IJ SOLN: 6.2500 mg | INTRAMUSCULAR | Status: DC | PRN

## 2014-08-24 MED ORDER — PHENYLEPHRINE 8 MG IN D5W 100 ML (0.08MG/ML) PREMIX OPTIME
INJECTION | INTRAVENOUS | Status: DC | PRN
  Administered 2014-08-24: 60 ug/min via INTRAVENOUS

## 2014-08-24 MED ORDER — LACTATED RINGERS IV SOLN
INTRAVENOUS | Status: DC
  Administered 2014-08-24 (×4): via INTRAVENOUS

## 2014-08-24 MED ORDER — PHENYLEPHRINE 8 MG IN D5W 100 ML (0.08MG/ML) PREMIX OPTIME
INJECTION | INTRAVENOUS | Status: AC
  Filled 2014-08-24: qty 100

## 2014-08-24 MED ORDER — SENNOSIDES-DOCUSATE SODIUM 8.6-50 MG PO TABS
2.0000 | ORAL_TABLET | ORAL | Status: DC
  Administered 2014-08-25 (×2): 2 via ORAL
  Filled 2014-08-24 (×3): qty 2

## 2014-08-24 MED ORDER — SIMETHICONE 80 MG PO CHEW
80.0000 mg | CHEWABLE_TABLET | ORAL | Status: DC
  Administered 2014-08-25 (×2): 80 mg via ORAL
  Filled 2014-08-24 (×2): qty 1

## 2014-08-24 MED ORDER — ONDANSETRON HCL 4 MG/2ML IJ SOLN: 4.0000 mg | INTRAMUSCULAR | Status: DC | PRN

## 2014-08-24 MED ORDER — OXYCODONE-ACETAMINOPHEN 5-325 MG PO TABS
1.0000 | ORAL_TABLET | ORAL | Status: DC | PRN
  Administered 2014-08-25 (×3): 1 via ORAL
  Filled 2014-08-24 (×3): qty 1

## 2014-08-24 MED ORDER — MORPHINE SULFATE (PF) 0.5 MG/ML IJ SOLN
INTRAMUSCULAR | Status: DC | PRN
  Administered 2014-08-24: .1 mg via INTRATHECAL

## 2014-08-24 MED ORDER — CEFAZOLIN SODIUM-DEXTROSE 2-3 GM-% IV SOLR
INTRAVENOUS | Status: AC
  Filled 2014-08-24: qty 50

## 2014-08-24 MED ORDER — SCOPOLAMINE 1 MG/3DAYS TD PT72
MEDICATED_PATCH | TRANSDERMAL | Status: AC
  Administered 2014-08-24: 1.5 mg via TRANSDERMAL
  Filled 2014-08-24: qty 1

## 2014-08-24 MED ORDER — OXYTOCIN 40 UNITS IN LACTATED RINGERS INFUSION - SIMPLE MED
INTRAVENOUS | Status: DC | PRN
  Administered 2014-08-24: 40 [IU] via INTRAVENOUS

## 2014-08-24 MED ORDER — BUPIVACAINE HCL (PF) 0.25 % IJ SOLN
INTRAMUSCULAR | Status: DC | PRN
  Administered 2014-08-24: 30 mL

## 2014-08-24 MED ORDER — IBUPROFEN 600 MG PO TABS
600.0000 mg | ORAL_TABLET | Freq: Four times a day (QID) | ORAL | Status: DC
  Administered 2014-08-24 – 2014-08-26 (×8): 600 mg via ORAL
  Filled 2014-08-24 (×8): qty 1

## 2014-08-24 MED ORDER — NALBUPHINE HCL 10 MG/ML IJ SOLN: 5.0000 mg | Freq: Once | INTRAMUSCULAR | Status: DC | PRN

## 2014-08-24 MED ORDER — FENTANYL CITRATE 0.05 MG/ML IJ SOLN
INTRAMUSCULAR | Status: DC | PRN
  Administered 2014-08-24: 25 ug via INTRATHECAL

## 2014-08-24 MED ORDER — LACTATED RINGERS IV SOLN: INTRAVENOUS | Status: DC

## 2014-08-24 MED ORDER — NALOXONE HCL 0.4 MG/ML IJ SOLN: 0.4000 mg | INTRAMUSCULAR | Status: DC | PRN

## 2014-08-24 MED ORDER — SIMETHICONE 80 MG PO CHEW
80.0000 mg | CHEWABLE_TABLET | Freq: Three times a day (TID) | ORAL | Status: DC
  Administered 2014-08-24 – 2014-08-26 (×6): 80 mg via ORAL
  Filled 2014-08-24 (×6): qty 1

## 2014-08-24 MED ORDER — KETOROLAC TROMETHAMINE 30 MG/ML IJ SOLN
INTRAMUSCULAR | Status: AC
  Administered 2014-08-24: 30 mg via INTRAVENOUS
  Filled 2014-08-24: qty 1

## 2014-08-24 MED ORDER — DIPHENHYDRAMINE HCL 50 MG/ML IJ SOLN: 12.5000 mg | INTRAMUSCULAR | Status: DC | PRN

## 2014-08-24 MED ORDER — TETANUS-DIPHTH-ACELL PERTUSSIS 5-2.5-18.5 LF-MCG/0.5 IM SUSP: 0.5000 mL | Freq: Once | INTRAMUSCULAR | Status: DC

## 2014-08-24 MED ORDER — DIBUCAINE 1 % RE OINT: 1.0000 "application " | TOPICAL_OINTMENT | RECTAL | Status: DC | PRN

## 2014-08-24 MED ORDER — MENTHOL 3 MG MT LOZG: 1.0000 | LOZENGE | OROMUCOSAL | Status: DC | PRN

## 2014-08-24 MED ORDER — WITCH HAZEL-GLYCERIN EX PADS: 1.0000 "application " | MEDICATED_PAD | CUTANEOUS | Status: DC | PRN

## 2014-08-24 MED ORDER — KETOROLAC TROMETHAMINE 30 MG/ML IJ SOLN
30.0000 mg | Freq: Once | INTRAMUSCULAR | Status: AC
  Administered 2014-08-24: 30 mg via INTRAVENOUS

## 2014-08-24 MED ORDER — PRENATAL MULTIVITAMIN CH
1.0000 | ORAL_TABLET | Freq: Every day | ORAL | Status: DC
  Administered 2014-08-25 – 2014-08-26 (×2): 1 via ORAL
  Filled 2014-08-24 (×2): qty 1

## 2014-08-24 MED ORDER — DIPHENHYDRAMINE HCL 25 MG PO CAPS: 25.0000 mg | ORAL_CAPSULE | Freq: Four times a day (QID) | ORAL | Status: DC | PRN

## 2014-08-24 MED ORDER — ONDANSETRON HCL 4 MG/2ML IJ SOLN
INTRAMUSCULAR | Status: AC
  Filled 2014-08-24: qty 2

## 2014-08-24 MED ORDER — OXYCODONE-ACETAMINOPHEN 5-325 MG PO TABS
2.0000 | ORAL_TABLET | ORAL | Status: DC | PRN
  Administered 2014-08-25 – 2014-08-26 (×2): 2 via ORAL
  Filled 2014-08-24 (×2): qty 2

## 2014-08-24 MED ORDER — SODIUM CHLORIDE 0.9 % IJ SOLN: 3.0000 mL | INTRAMUSCULAR | Status: DC | PRN

## 2014-08-24 MED ORDER — LANOLIN HYDROUS EX OINT: 1.0000 "application " | TOPICAL_OINTMENT | CUTANEOUS | Status: DC | PRN

## 2014-08-24 MED ORDER — SCOPOLAMINE 1 MG/3DAYS TD PT72
1.0000 | MEDICATED_PATCH | Freq: Once | TRANSDERMAL | Status: DC
  Administered 2014-08-24: 1.5 mg via TRANSDERMAL

## 2014-08-24 MED ORDER — ZOLPIDEM TARTRATE 5 MG PO TABS: 5.0000 mg | ORAL_TABLET | Freq: Every evening | ORAL | Status: DC | PRN

## 2014-08-24 MED ORDER — ONDANSETRON HCL 4 MG/2ML IJ SOLN: 4.0000 mg | Freq: Three times a day (TID) | INTRAMUSCULAR | Status: DC | PRN

## 2014-08-24 MED ORDER — MORPHINE SULFATE 0.5 MG/ML IJ SOLN
INTRAMUSCULAR | Status: AC
  Filled 2014-08-24: qty 10

## 2014-08-24 SURGICAL SUPPLY — 38 items
APL SKNCLS STERI-STRIP NONHPOA (GAUZE/BANDAGES/DRESSINGS) ×2
BENZOIN TINCTURE PRP APPL 2/3 (GAUZE/BANDAGES/DRESSINGS) ×4 IMPLANT
CATH ROBINSON RED A/P 16FR (CATHETERS) IMPLANT
CLAMP CORD UMBIL (MISCELLANEOUS) IMPLANT
CLIP FILSHIE TUBAL LIGA STRL (Clip) ×6 IMPLANT
CLOSURE WOUND 1/2 X4 (GAUZE/BANDAGES/DRESSINGS) ×1
CLOTH BEACON ORANGE TIMEOUT ST (SAFETY) ×4 IMPLANT
DRAPE SHEET LG 3/4 BI-LAMINATE (DRAPES) IMPLANT
DRSG COVADERM 4X10 (GAUZE/BANDAGES/DRESSINGS) ×2 IMPLANT
DRSG OPSITE POSTOP 4X10 (GAUZE/BANDAGES/DRESSINGS) ×4 IMPLANT
DURAPREP 26ML APPLICATOR (WOUND CARE) ×4 IMPLANT
ELECT REM PT RETURN 9FT ADLT (ELECTROSURGICAL) ×4
ELECTRODE REM PT RTRN 9FT ADLT (ELECTROSURGICAL) ×2 IMPLANT
EXTRACTOR VACUUM M CUP 4 TUBE (SUCTIONS) IMPLANT
EXTRACTOR VACUUM M CUP 4' TUBE (SUCTIONS)
GLOVE BIOGEL PI IND STRL 7.5 (GLOVE) ×2 IMPLANT
GLOVE BIOGEL PI INDICATOR 7.5 (GLOVE) ×2
GLOVE ECLIPSE 7.5 STRL STRAW (GLOVE) ×4 IMPLANT
GOWN STRL REUS W/TWL LRG LVL3 (GOWN DISPOSABLE) ×12 IMPLANT
KIT ABG SYR 3ML LUER SLIP (SYRINGE) IMPLANT
NDL HYPO 25X5/8 SAFETYGLIDE (NEEDLE) IMPLANT
NEEDLE HYPO 22GX1.5 SAFETY (NEEDLE) ×4 IMPLANT
NEEDLE HYPO 25X5/8 SAFETYGLIDE (NEEDLE) ×4 IMPLANT
NS IRRIG 1000ML POUR BTL (IV SOLUTION) ×4 IMPLANT
PACK C SECTION WH (CUSTOM PROCEDURE TRAY) ×4 IMPLANT
PAD OB MATERNITY 4.3X12.25 (PERSONAL CARE ITEMS) ×4 IMPLANT
RTRCTR C-SECT PINK 25CM LRG (MISCELLANEOUS) IMPLANT
STRIP CLOSURE SKIN 1/2X4 (GAUZE/BANDAGES/DRESSINGS) ×3 IMPLANT
SUT MNCRL 0 VIOLET CTX 36 (SUTURE) IMPLANT
SUT MONOCRYL 0 CTX 36 (SUTURE)
SUT VIC AB 0 CTX 36 (SUTURE) ×12
SUT VIC AB 0 CTX36XBRD ANBCTRL (SUTURE) ×6 IMPLANT
SUT VIC AB 2-0 CT1 27 (SUTURE) ×4
SUT VIC AB 2-0 CT1 TAPERPNT 27 (SUTURE) ×2 IMPLANT
SUT VIC AB 4-0 KS 27 (SUTURE) ×4 IMPLANT
SYR 30ML LL (SYRINGE) ×4 IMPLANT
TOWEL OR 17X24 6PK STRL BLUE (TOWEL DISPOSABLE) ×4 IMPLANT
TRAY FOLEY CATH 14FR (SET/KITS/TRAYS/PACK) ×4 IMPLANT

## 2014-08-24 NOTE — Anesthesia Preprocedure Evaluation (Signed)
Anesthesia Evaluation  Patient identified by MRN, date of birth, ID band Patient awake    Reviewed: Allergy & Precautions, H&P , Patient's Chart, lab work & pertinent test results  Airway Mallampati: II TM Distance: >3 FB Neck ROM: full    Dental no notable dental hx.    Pulmonary  breath sounds clear to auscultation  Pulmonary exam normal       Cardiovascular Exercise Tolerance: Good Rhythm:regular Rate:Normal     Neuro/Psych    GI/Hepatic   Endo/Other  diabetes, Gestational  Renal/GU      Musculoskeletal   Abdominal   Peds  Hematology   Anesthesia Other Findings   Reproductive/Obstetrics                           Anesthesia Physical Anesthesia Plan  ASA: II  Anesthesia Plan: Spinal   Post-op Pain Management:    Induction:   Airway Management Planned:   Additional Equipment:   Intra-op Plan:   Post-operative Plan:   Informed Consent: I have reviewed the patients History and Physical, chart, labs and discussed the procedure including the risks, benefits and alternatives for the proposed anesthesia with the patient or authorized representative who has indicated his/her understanding and acceptance.   Dental Advisory Given  Plan Discussed with: CRNA  Anesthesia Plan Comments: (Lab work confirmed with CRNA in room. Platelets okay. Discussed spinal anesthetic, and patient consents to the procedure:  included risk of possible headache,backache, failed block, allergic reaction, and nerve injury. This patient was asked if she had any questions or concerns before the procedure started. )        Anesthesia Quick Evaluation  

## 2014-08-24 NOTE — Anesthesia Postprocedure Evaluation (Signed)
  Anesthesia Post Note  Patient: Catherine GlassingMaria Villa  Procedure(s) Performed: Procedure(s) (LRB): CESAREAN SECTION  (N/A) BILATERAL TUBAL LIGATION (Bilateral)  Anesthesia type: Spinal  Patient location: PACU  Post pain: Pain level controlled  Post assessment: Post-op Vital signs reviewed  Last Vitals:  Filed Vitals:   08/24/14 1115  BP: 86/56  Pulse: 62  Temp:   Resp: 18    Post vital signs: Reviewed  Level of consciousness: awake  Complications: No apparent anesthesia complications

## 2014-08-24 NOTE — Transfer of Care (Signed)
Immediate Anesthesia Transfer of Care Note  Patient: Catherine GlassingMaria Villa  Procedure(s) Performed: Procedure(s): CESAREAN SECTION  (N/A) BILATERAL TUBAL LIGATION (Bilateral)  Patient Location: PACU  Anesthesia Type:Spinal  Level of Consciousness: awake, alert , oriented and patient cooperative  Airway & Oxygen Therapy: Patient Spontanous Breathing  Post-op Assessment: Report given to RN and Post -op Vital signs reviewed and stable  Post vital signs: Reviewed and stable  Last Vitals:  Filed Vitals:   08/24/14 0746  BP: 106/62  Pulse: 74  Temp: 37 C  Resp: 16    Complications: No apparent anesthesia complications

## 2014-08-24 NOTE — Op Note (Signed)
Catherine Villa PROCEDURE DATE: 08/24/2014  PREOPERATIVE DIAGNOSIS: Intrauterine pregnancy at  [redacted]w[redacted]d weeks gestation; elective repeat and undesired fertility  POSTOPERATIVE DIAGNOSIS: The same  PROCEDURE: Repeat Low Transverse Cesarean Section with bilateral tubal ligation  SURGEON:  Dr. Candelaria Celeste  ASSISTANT: none  INDICATIONS: Catherine Villa is a 34 y.o. [id number] at [redacted]w[redacted]d scheduled for cesarean section secondary to elective repeat.  The risks of cesarean section discussed with the patient included but were not limited to: bleeding which may require transfusion or reoperation; infection which may require antibiotics; injury to bowel, bladder, ureters or other surrounding organs; injury to the fetus; need for additional procedures including hysterectomy in the event of a life-threatening hemorrhage; placental abnormalities wth subsequent pregnancies, incisional problems, thromboembolic phenomenon and other postoperative/anesthesia complications. The patient concurred with the proposed plan, giving informed written consent for the procedure.    FINDINGS:  Viable female infant in vertex presentation.  Apgars 9 and 9, weight 7 pounds and 9 ounces.  Clear amniotic fluid.  Intact placenta, three vessel cord.  Normal uterus, fallopian tubes and ovaries bilaterally.  ANESTHESIA:    Spinal INTRAVENOUS FLUIDS:2600 ml ESTIMATED BLOOD LOSS: 850 ml URINE OUTPUT:  1000 ml SPECIMENS: Placenta sent to L&D COMPLICATIONS: None immediate  PROCEDURE IN DETAIL:  The patient received intravenous antibiotics and had sequential compression devices applied to her lower extremities while in the preoperative area.  She was then taken to the operating room where spinal anesthesia was administered and was found to be adequate. She was then placed in a dorsal supine position with a leftward tilt, and prepped and draped in a sterile manner.  A foley catheter was placed into her bladder and attached to  constant gravity, which drained clear fluid throughout.  After an adequate timeout was performed, a Pfannenstiel skin incision was made with scalpel and carried through to the underlying layer of fascia. The fascia was incised in the midline and this incision was extended bilaterally using the Mayo scissors. Kocher clamps were applied to the superior aspect of the fascial incision and the underlying rectus muscles were dissected off with Mayo scissors. A similar process was carried out on the inferior aspect of the facial incision. The rectus muscles were separated in the midline bluntly and the peritoneum was entered bluntly. An Alexis retractor was placed to aid in visualization of the uterus.  Attention was turned to the lower uterine segment where a transverse hysterotomy was made with a scalpel and extended bilaterally bluntly. The infant was successfully delivered, and cord was clamped and cut and infant was handed over to awaiting neonatology team. Uterine massage was then administered and the placenta delivered intact with three-vessel cord. The uterus was then cleared of clot and debris.  The hysterotomy was closed with 0 Vicryl in a single, running locked fashion. Overall, excellent hemostasis was noted. The abdomen and the pelvis were cleared of all clot and debris and the Jon Gills was removed. Hemostasis was confirmed on all surfaces.   Attention was then turned to the fallopian tubes.  A Filshie clip was placed on both tubes, about 2 cm from the cornua, with care given to incorporate the underlying mesosalpinx on both sides, allowing for bilateral tubal sterilization.  The fascia was then closed using 0 Vicryl in a running fashion.  The skin was closed with 4-0 vicryl. The patient tolerated the procedure well. Sponge, lap, instrument and needle counts were correct x 2. She was taken to the recovery room in stable condition.  Catherine HeritageJacob J Stinson, DO 08/24/2014 11:44 AM

## 2014-08-24 NOTE — Progress Notes (Addendum)
I stopped to check on patients need I ordered her dinner, snack and breakfast, by Orlan LeavensViria Alvarez, Spanish Interpreter

## 2014-08-24 NOTE — Progress Notes (Signed)
I was present during the C-Section with Dr Adrian BlackwaterStinson. Eda H Royal  Interpreter.

## 2014-08-24 NOTE — Anesthesia Postprocedure Evaluation (Signed)
Anesthesia Post Note  Patient: Catherine GlassingMaria Villa  Procedure(s) Performed: Procedure(s) (LRB): CESAREAN SECTION  (N/A) BILATERAL TUBAL LIGATION (Bilateral)  Anesthesia type: SAB  Patient location: Mother/Baby  Post pain: Pain level controlled  Post assessment: Post-op Vital signs reviewed  Last Vitals:  Filed Vitals:   08/24/14 1205  BP: 99/59  Pulse: 65  Temp: 37.1 C  Resp: 18    Post vital signs: Reviewed  Level of consciousness: awake  Complications: No apparent anesthesia complications

## 2014-08-24 NOTE — H&P (Signed)
LABOR ADMISSION HISTORY AND PHYSICAL  Catherine GlassingMaria Villa is a 34 y.o. female 523P2002 with IUP at 6644w1d presenting for scheduled cesarean section 2/2 previous c/s x2. She reports +FMs, No LOF, no VB, no blurry vision, headaches or peripheral edema, and RUQ pain.  She plans on bottle feeding. She request BTL for birth control.   Estimated Date of Delivery: 08/30/14   Prenatal History/Complications:  Past Medical History: Past Medical History  Diagnosis Date  . Diabetes mellitus without complication 2016    diet controlled - 2016 pregnancy    Past Surgical History: Past Surgical History  Procedure Laterality Date  . Cesarean section  2004, 2009    x 2    Obstetrical History: OB History    Gravida Para Term Preterm AB TAB SAB Ectopic Multiple Living   3 2 2  0 0 0 0 0 0 2      Social History: History   Social History  . Marital Status: Married    Spouse Name: N/A  . Number of Children: N/A  . Years of Education: N/A   Social History Main Topics  . Smoking status: Never Smoker   . Smokeless tobacco: Never Used  . Alcohol Use: No  . Drug Use: No  . Sexual Activity: Yes    Birth Control/ Protection: None     Comment: pregnant   Other Topics Concern  . None   Social History Narrative    Family History: Family History  Problem Relation Age of Onset  . Hypertension Mother     Allergies: No Known Allergies  Prescriptions prior to admission  Medication Sig Dispense Refill Last Dose  . aspirin EC 81 MG tablet Take 1 tablet (81 mg total) by mouth daily. For prevention of preeclampsia 200 tablet 2 08/23/2014 at Unknown time  . Prenatal Vit-Fe Fumarate-FA (PRENATAL MULTIVITAMIN) TABS tablet Take 1 tablet by mouth daily at 12 noon.   08/23/2014 at Unknown time     Review of Systems   All systems reviewed and negative except as stated in HPI  Blood pressure 106/62, pulse 74, temperature 98.6 F (37 C), temperature source Oral, resp. rate 16, last menstrual  period 11/23/2013, SpO2 100 %. General appearance: alert and cooperative Lungs: clear to auscultation bilaterally Heart: regular rate and rhythm Abdomen: soft, non-tender; bowel sounds normal Extremities: Homans sign is negative, no sign of DVT     Prenatal labs: ABO, Rh: --/--/O POS, O POS (02/25 1045) Antibody: NEG (02/25 1045) Rubella:   RPR: Non Reactive (02/25 1045)  HBsAg: Negative (08/14 0000)  HIV: NONREACTIVE (12/10 1544)  GBS:    Genetic screening  Neg QUAd Anatomy US normal except EIF  Clinic HRC  Dating  LMP  Genetic Screen Quad:    Neg           Anatomic US Nml except EIF  GTT Class A2/B DM  TDaP vaccine Dec 2015  Flu vaccine  Declined  GBS  negative  Contraception Possible BTL  Baby Food Bottle  Circumcision Undecided  Pediatrician [facility]  Support Person Husband    Prenatal Labs  Blood type: O/Positive/-- (08/13 0000)   Antibody:Negative (08/13 0000)  Rubella: Immune (08/14 0000)  RPR: NON REAC (12/10 1544)   HBsAg: Negative (08/14 0000)   HIV: NONREACTIVE (12/10 1544)   GBS:   Pap:WNL in 2015       Results for orders placed or performed during the hospital encounter of 08/23/14 (from the past 24 hour(s))  CBC  Collection Time: 08/23/14 10:45 AM  Result Value Ref Range   WBC 10.5 4.0 - 10.5 K/uL   RBC 4.25 3.87 - 5.11 MIL/uL   Hemoglobin 12.1 12.0 - 15.0 g/dL   HCT 40.9 81.1 - 91.4 %   MCV 86.6 78.0 - 100.0 fL   MCH 28.5 26.0 - 34.0 pg   MCHC 32.9 30.0 - 36.0 g/dL   RDW 78.2 95.6 - 21.3 %   Platelets 191 150 - 400 K/uL  RPR   Collection Time: 08/23/14 10:45 AM  Result Value Ref Range   RPR Ser Ql Non Reactive Non Reactive  Basic metabolic panel   Collection Time: 08/23/14 10:45 AM  Result Value Ref Range   Sodium 135 135 - 145 mmol/L   Potassium 3.9 3.5 - 5.1 mmol/L   Chloride 108 96 - 112 mmol/L   CO2 24 19 - 32 mmol/L   Glucose, Bld 74 70 - 99 mg/dL   BUN 8 6 - 23 mg/dL   Creatinine, Ser 0.86 0.50 - 1.10 mg/dL    Calcium 8.4 8.4 - 57.8 mg/dL   GFR calc non Af Amer >90 >90 mL/min   GFR calc Af Amer >90 >90 mL/min   Anion gap 3 (L) 5 - 15  Type and screen   Collection Time: 08/23/14 10:45 AM  Result Value Ref Range   ABO/RH(D) O POS    Antibody Screen NEG    Sample Expiration 08/26/2014   ABO/Rh   Collection Time: 08/23/14 10:45 AM  Result Value Ref Range   ABO/RH(D) O POS     Patient Active Problem List   Diagnosis Date Noted  . Advanced maternal age, 1st pregnancy   . Advanced maternal age of multigravida, antepartum 04/05/2014  . Preexisting diabetes complicating pregnancy, antepartum 02/26/2014  . Previous cesarean section x 2, antepartum 02/26/2014    Assessment: Catherine Villa is a 34 y.o. G3P2002 at [redacted]w[redacted]d here for repeat cesarean section after 2 previous  #MOF: bottle #MOC: BTL #Circ:  Undecided  The risks of cesarean section discussed with the patient included but were not limited to: bleeding which may require transfusion or reoperation; infection which may require antibiotics; injury to bowel, bladder, ureters or other surrounding organs; injury to the fetus; need for additional procedures including hysterectomy in the event of a life-threatening hemorrhage; placental abnormalities wth subsequent pregnancies, incisional problems, thromboembolic phenomenon and other postoperative/anesthesia complications, also ectopic pregnancy. The patient concurred with the proposed plan, giving informed written consent for the procedure.   Patient has been NPO since before midnight she will remain NPO for procedure. Anesthesia and OR aware. Preoperative prophylactic antibiotics and SCDs ordered on call to the OR.  To OR when ready.     Catherine Villa 08/24/2014, 8:43 AM

## 2014-08-24 NOTE — Anesthesia Procedure Notes (Signed)
Spinal Patient location during procedure: OR Preanesthetic Checklist Completed: patient identified, site marked, surgical consent, pre-op evaluation, timeout performed, IV checked, risks and benefits discussed and monitors and equipment checked Spinal Block Patient position: sitting Prep: DuraPrep Patient monitoring: heart rate, cardiac monitor, continuous pulse ox and blood pressure Approach: midline Location: L3-4 Injection technique: single-shot Needle Needle type: Sprotte  Needle gauge: 24 G Needle length: 9 cm Assessment Sensory level: T4 Additional Notes Spinal Dosage in OR  Bupivicaine ml       1.2 PFMS04   mcg        100 Fentanyl mcg            25    

## 2014-08-25 LAB — CBC
HEMATOCRIT: 30.5 % — AB (ref 36.0–46.0)
Hemoglobin: 9.9 g/dL — ABNORMAL LOW (ref 12.0–15.0)
MCH: 28.2 pg (ref 26.0–34.0)
MCHC: 32.8 g/dL (ref 30.0–36.0)
MCV: 86.2 fL (ref 78.0–100.0)
Platelets: 152 10*3/uL (ref 150–400)
RBC: 3.54 MIL/uL — ABNORMAL LOW (ref 3.87–5.11)
RDW: 14.2 % (ref 11.5–15.5)
WBC: 10 10*3/uL (ref 4.0–10.5)

## 2014-08-25 LAB — BIRTH TISSUE RECOVERY COLLECTION (PLACENTA DONATION)

## 2014-08-25 NOTE — Progress Notes (Signed)
Subjective: Postpartum Day 1: Cesarean Delivery Patient reports incisional pain and tolerating PO.    Objective: Vital signs in last 24 hours: Temp:  [98.3 F (36.8 C)-98.9 F (37.2 C)] 98.5 F (36.9 C) (02/27 0437) Pulse Rate:  [62-84] 63 (02/27 0437) Resp:  [16-22] 18 (02/27 0437) BP: (85-129)/(46-90) 93/63 mmHg (02/27 0437) SpO2:  [96 %-100 %] 97 % (02/27 0437)  Physical Exam:  General: alert, cooperative and no distress Lochia: appropriate Uterine Fundus: firm Incision: no significant drainage, no significant erythema DVT Evaluation: No evidence of DVT seen on physical exam. No cords or calf tenderness. No significant calf/ankle edema.   Recent Labs  08/23/14 1045 08/25/14 0500  HGB 12.1 9.9*  HCT 36.8 30.5*    Assessment/Plan: Status post Cesarean section. Doing well postoperatively.  [facility]. Breastfeeding. Continue current care. Plan d/c tomorrow.   Beverely Lowdamo, Keyry Iracheta 08/25/2014, 6:50 AM

## 2014-08-25 NOTE — Lactation Note (Signed)
This note was copied from the chart of Catherine Villa. Lactation Consultation Note  Initial visit made.  FOB present to assist with interpreting.  Mom states baby is latching well but fall asleep easily.  Reviewed waking techniques and breast massage.  FOB asking if its okay to use formula during the day at home if mom is tired.  Reviewed supply and demand and formula use discouraged if breastfeeding and milk supply is good.  Manual pump given for use after discharge.  Instructed to feed baby with feeding cues and to call for assist prn,  Patient Name: Catherine Villa [id number]'XToday's Date: 08/25/2014 Reason for consult: Initial assessment   Maternal Data    Feeding Length of feed: 10 min  LATCH Score/Interventions                      Lactation Tools Discussed/Used Pump Review: Setup, frequency, and cleaning;Milk Storage   Consult Status Consult Status: Follow-up Date: 08/26/14 Follow-up type: In-patient    Huston FoleyMOULDEN, Srihan Brutus S 08/25/2014, 11:07 AM

## 2014-08-26 MED ORDER — OXYCODONE-ACETAMINOPHEN 5-325 MG PO TABS: 1.0000 | ORAL_TABLET | Freq: Four times a day (QID) | ORAL | Status: AC | PRN

## 2014-08-26 MED ORDER — IBUPROFEN 600 MG PO TABS: 600.0000 mg | ORAL_TABLET | Freq: Four times a day (QID) | ORAL | Status: AC

## 2014-08-26 NOTE — Progress Notes (Signed)
Checked on patients needs.  Patient was resting.  Spanish Interpreter

## 2014-08-26 NOTE — Progress Notes (Signed)
Discharge teaching completed with spanish interpreter. FOB at bedside with pt. Discharge instructions given. Pt verbalized understanding to spanish interpreter.

## 2014-08-26 NOTE — Progress Notes (Signed)
Checked on patients needs.  °Spanish Interpreter  °

## 2014-08-26 NOTE — Progress Notes (Signed)
Assisted RN with interpretation of discharge instructions for baby patient and mother patient.  Spanish Interpreter

## 2014-08-26 NOTE — Discharge Summary (Signed)
Obstetric Discharge Summary Reason for Admission: cesarean section Prenatal Procedures: NST Intrapartum Procedures: cesarean: low cervical, transverse Postpartum Procedures: none Complications-Operative and Postpartum: none HEMOGLOBIN  Date Value Ref Range Status  08/25/2014 9.9* 12.0 - 15.0 g/dL Final    Comment:    REPEATED TO VERIFY DELTA CHECK NOTED   02/08/2014 12.8 g/dL Final   HCT  Date Value Ref Range Status  08/25/2014 30.5* 36.0 - 46.0 % Final  02/08/2014 40 % Final    Physical Exam:  General: alert, cooperative and no distress Lochia: appropriate Uterine Fundus: firm Incision: no significant drainage, no significant erythema DVT Evaluation: No evidence of DVT seen on physical exam. No cords or calf tenderness. No significant calf/ankle edema.  Discharge Diagnoses: Term Pregnancy-delivered  Discharge Information: Date: 08/26/2014 Activity: pelvic rest Diet: routine Medications: PNV, Ibuprofen and Percocet Condition: stable Instructions: refer to practice specific booklet Discharge to: home Follow-up Information    [facility] Schedule an appointment as soon as possible for a visit in 6 weeks.   Contact information:   [address] [place] [phone]      Newborn Data: Live born female  Birth Weight: 7 lb 9.2 oz (3435 g) APGAR: 9, 9  Home with mother.  Beverely Lowdamo, Elena 08/26/2014, 7:00 AM   I spoke with and examined patient and agree with resident/PA/SNM's note and plan of care.  C/S was elective repeat, she also had a BTL. Postoperative course has been uncomplicated and she is ready for d/c on POD#2.  Eating, drinking, voiding, ambulating well.  +flatus.  Lochia and pain wnl.  Denies dizziness, lightheadedness, or sob. No complaints.  Incision: bulky dressing removed, honeycomb dressing intact w/o much drainage, no s/s infection.  Br/bottlefeeding Had A2/B DM, will need 2hr gtt 6-8wks  pp  Cheral MarkerKimberly R. Araminta Zorn, CNM, [facility] 08/26/2014 8:45 AM

## 2014-08-26 NOTE — Discharge Instructions (Signed)
Cuidados en el postparto luego de un parto por cesárea  °(Postpartum Care After Cesarean Delivery) °Después del parto (período de postparto), la estadía normal en el hospital es de 24-72 horas. Si hubo problemas con el trabajo de parto o el parto, o si tiene otros problemas médicos, es posible que deba permanecer en el hospital por más tiempo.  °Mientras esté en el hospital, recibirá ayuda e instrucciones sobre cómo cuidar de usted misma y de su bebé recién nacido durante el postparto.  °Mientras esté en el hospital:  °· Es normal que sienta dolor o molestias en la incisión en el abdomen. Asegúrese de decirle a las enfermeras si siente dolor, así como donde siente el dolor y qué empeora el dolor. °· Si está amamantando, puede sentir contracciones dolorosas en el útero durante algunas semanas. Esto es normal. Las contracciones ayudan a que el útero vuelva a su tamaño normal. °· Es normal tener algo de sangrado después del parto. °¨ Durante los primeros 1-3 días después del parto, el flujo es de color rojo y la cantidad puede ser similar a un período. °¨ Es frecuente que el flujo se inicie y se detenga. °¨ En los primeros días, puede eliminar algunos coágulos pequeños. Informe a las enfermeras si comienza a eliminar coágulos grandes o aumenta el flujo. °¨ No  elimine los coágulos de sangre por el inodoro antes de que la enfermera los vea. °¨ Durante los próximos 3 a 10 días después del parto, el flujo debe ser más acuoso y rosado o marrón. °¨ De diez a catorce días después del parto, el flujo debe ser una pequeña cantidad de secreción de color blanco amarillento. °¨ La cantidad de flujo disminuirá en las primeras semanas después del parto. El flujo puede detenerse en 6-8 semanas. La mayoría de las mujeres no tienen más flujo a las 12 semanas después del parto. °· Usted debe cambiar sus apósitos con frecuencia. °· Lávese bien las manos con agua y jabón durante al menos 20 segundos después de cambiar el apósito, usar el  baño o antes de sostener o alimentar a su recién nacido. °· Se le quitará la vía intravenosa (IV) cuando ya esté bebiendo suficientes líquidos. °· El tubo de drenaje para la orina (catéter urinario) que se inserta antes del parto puede ser retirado luego de 6-8 horas después del parto o cuando las piernas vuelvan a tener sensibilidad. Usted puede sentir que tiene que vaciar la vejiga durante las primeras 6-8 horas después de que le quiten el catéter. °· Si se siente débil, mareada o se desmaya, llame a su enfermera antes de levantarse de la cama por primera vez y antes de tomar una ducha por primera vez. °· En los primeros días después del parto, podrá sentir las mamas sensibles y llenas. Esto se llama congestión. La sensibilidad en los senos por lo general desaparece dentro de las 48-72 horas después de que ocurre la congestión. También puede notar que la leche se escapa de sus senos. Si no está amamantando no estimule sus pechos. La estimulación de las mamas hace que sus senos produzcan más leche. °· Pasar tanto tiempo como sea posible con el bebé recién nacido es muy importante. Durante este tiempo, usted y su bebé deben sentirse cerca y conocerse uno al otro. Tener al bebé en su habitación (alojamiento conjunto) ayudará a fortalecer el vínculo con el bebé recién nacido. Esto le dará tiempo para conocerlo y atenderlo de manera cómoda. °· Las hormonas se modifican después del parto. A   veces, los cambios hormonales pueden causar tristeza o ganas de llorar por un tiempo. Estos sentimientos no deben durar más de unos pocos días. Si duran más que eso, debe hablar con su médico. °· Si lo desea, hable con su médico acerca de los métodos de planificación familiar o métodos anticonceptivos. °· Hable con su médico acerca de las vacunas. El médico puede indicarle que se aplique las siguientes vacunas antes de salir del hospital: °¨ Vacuna contra el tétanos, la difteria y la tos ferina (Tdap) o el tétanos y la difteria (Td).  Es muy importante que usted y su familia (incluyendo a los abuelos) u otras personas que cuidan al recién nacido estén al día con las vacunas Tdap o Td. Las vacunas Tdap o Td pueden ayudar a proteger al recién nacido de enfermedades. °¨ Inmunización contra la rubéola. °¨ Inmunización contra la varicela. °¨ Inmunización contra la gripe. Usted debe recibir esta vacunación anual si no la ha recibido durante el embarazo. °Document Released: 06/01/2012 °ExitCare® Patient Information ©2015 [employer]. This information is not intended to replace advice given to you by your health care provider. Make sure you discuss any questions you have with your health care provider. ° °

## 2014-08-26 NOTE — Progress Notes (Signed)
Assisted RN with interpretation of medical questions.  Ordered patient late snack and breakfast.  Family brought pt dinner.  Spanish Interpreter

## 2014-08-27 NOTE — Progress Notes (Signed)
Ur chart review completed.  

## 2014-08-28 ENCOUNTER — Encounter (HOSPITAL_COMMUNITY): Payer: Self-pay | Admitting: Family Medicine

## 2014-09-26 ENCOUNTER — Ambulatory Visit: Payer: Medicaid Other | Admitting: Obstetrics & Gynecology

## 2014-09-27 ENCOUNTER — Encounter: Payer: Self-pay | Admitting: Obstetrics & Gynecology

## 2014-09-27 ENCOUNTER — Encounter: Payer: Self-pay | Admitting: *Deleted

## 2014-09-28 ENCOUNTER — Encounter: Payer: Self-pay | Admitting: Obstetrics & Gynecology

## 2014-09-28 ENCOUNTER — Ambulatory Visit (INDEPENDENT_AMBULATORY_CARE_PROVIDER_SITE_OTHER): Payer: Medicaid Other | Admitting: Obstetrics & Gynecology

## 2014-09-28 DIAGNOSIS — Z8632 Personal history of gestational diabetes: Secondary | ICD-10-CM | POA: Diagnosis not present

## 2014-09-28 NOTE — Patient Instructions (Signed)
Parto por cesárea - Cuidados posteriores  °(Cesarean Delivery, Care After) °Siga estas instrucciones durante las próximas semanas. Estas indicaciones le proporcionan información general acerca de cómo deberá cuidarse después del procedimiento. El médico también podrá darle instrucciones más específicas. El tratamiento se ha planificado de acuerdo a las prácticas médicas actuales, pero a veces se producen problemas. Comuníquese con el médico si tiene algún problema o tiene dudas cuando vuelva a su casa.  °INSTRUCCIONES PARA EL CUIDADO EN EL HOGAR  °· Tome sólo medicamentos de venta libre o recetados, según las indicaciones del médico. °· No beba alcohol, especialmente si está amamantando o toma analgésicos. °· Nomastique tabaco ni fume. °· Continúe con un adecuado cuidado perineal. El buen cuidado perineal incluye: °¨ Higienizarse de adelante hacia atrás. °¨ Mantener la zona perineal limpia. °· Controlar diariamente el corte (incisión) y observar si aumenta el enrojecimiento, si supura, se hincha o se separa la piel. °· Limpie la incisión suavemente con jabón y agua todos los días, y luego séquela dando golpecitos. Si el médico la autoriza, deje la incisión al descubierto. Use un apósito (vendaje) si drena líquido o la incisión parece irritada. Si las pequeñas tiras adhesivas que cruzan la incisión no se caen dentro de los 7 días, retírelas suavemente. °· Abrace una almohada al toser o estornudar hasta que la incisión se cure. Esto ayuda a aliviar el dolor. °· No conduzca vehículos ni opere maquinarias hasta que el médico la autorice. °· Dúchese, lávese el cabello y tome baños de inmersión según las indicaciones de su médico. °· Utilice un sostén que le ajuste bien y que brinde buen soporte a sus mamas. °· Limite el uso de bombachas de sostén o medias panty. °· Beba suficiente líquido para mantener la orina clara o de color amarillo pálido. °· Consuma todos los días alimentos ricos en fibra como cereales y panes  integrales, arroz, frijoles y frutas frescas y verduras. Estos alimentos pueden ayudarla a prevenir o aliviar el estreñimiento. °· Reanude las actividades como subir escaleras, conducir automóviles, levantar objetos pesados, hacer ejercicios o viajar cuando le indique su médico. °· Hable con su médico acerca de reanudar la actividad sexual. Volver a la actividad sexual depende del riesgo de infección, la velocidad de la curación y la comodidad y su deseo de reanudarla. °· Trate de que alguien la ayude con las actividades del hogar y con el recién nacido al menos durante algunos días después de salir del hospital. °· Descanse todo lo que pueda. Trate de descansar o tomar una siesta mientras el bebé está durmiendo. °· Aumente sus actividades gradualmente. °· Cumpla con todos los controles programados para después del parto. Es muy importante asistir a todas las visitas de control programadas. En estas visitas, su médico va a controlarla para asegurarse de que esté sanando física y emocionalmente. °SOLICITE ATENCIÓN MÉDICA SI:  °· Elimina coágulos grandes por la vagina. Guarde algunos coágulos para mostrarle al médico. °· Tiene una secreción con feo olor que proviene de la vagina. °· Tiene dificultad para orinar. °· Orina con frecuencia. °· Siente dolor al orinar. °· Nota un cambio en sus movimientos intestinales. °· Aumenta el enrojecimiento, el dolor o la hinchazón en la zona de la incisión. °· Observa que supura pus en la incisión. °· La incisión se abre. °· Sus mamas le duelen, están duras o enrojecidas. °· Sufre un dolor intenso de cabeza. °· Tiene visión borrosa o ve manchas. °· Se siente triste o deprimida. °· Tiene pensamientos acerca de lastimarse o dañar al   recién nacido. °· Tiene preguntas acerca de su cuidado, la atención del recién nacido o acerca de los medicamentos. °· Se siente mareada o sufre un desmayo. °· Tiene una erupción. °· Siente dolor u observa enrojecimiento o hinchazón en el sitio en que  estaba la vía intravenosa (IV). °· Tiene náuseas o vómitos. °· Usted dejó de amamantar al bebé y no ha tenido su período menstrual dentro de las 12 semanas siguientes. °· No amamanta al bebé y no tuvo su período menstrual en las últimas 12 semanas. °· Tiene fiebre. °SOLICITE ATENCIÓN MÉDICA DE INMEDIATO SI:  °· Siente dolor persistente. °· Siente dolor en el pecho. °· Le falta el aire. °· Se desmaya. °· Siente dolor en la pierna. °· Siente dolor en el estómago. °· El sangrado vaginal satura dos o más apósitos en 1 hora. °ASEGÚRESE DE QUE:  °· Comprende estas instrucciones. °· Controlará su enfermedad. °· Recibirá ayuda de inmediato si no mejora o si empeora. °Document Released: 06/15/2005 Document Revised: 10/30/2013 °ExitCare® Patient Information ©2015 [employer]. This information is not intended to replace advice given to you by your health care provider. Make sure you discuss any questions you have with your health care provider. ° °

## 2014-09-28 NOTE — Progress Notes (Signed)
Patient ID: Catherine HiddenMaria G Natter, female   DOB: 1981/01/03, 34 y.o.   MRN: [id number] Subjective:s/p CS, had BTL, gest Dm     Catherine Villa is a 34 y.o. female who presents for a postpartum visit. She is 5 weeks postpartum following a low cervical transverse Cesarean section. I have fully reviewed the prenatal and intrapartum course. The delivery was at 3745w1d gestational weeks. Outcome: repeat cesarean section, low transverse incision. Anesthesia: spinal. Postpartum course has been good. Baby's course has been good. Baby is feeding by bottle -  . Bleeding no bleeding. Bowel function is normal. Bladder function is normal. Patient is not sexually active. Contraception method is tubal ligation. Postpartum depression screening: negative.  The following portions of the patient's history were reviewed and updated as appropriate: allergies, current medications, past family history, past medical history, past social history, past surgical history and problem list.  Review of Systems Pertinent items are noted in HPI.   Objective:    There were no vitals taken for this visit.  General:  alert, cooperative and no distress   Breasts:     Lungs:    Heart:     Abdomen: soft, non-tender; bowel sounds normal; no masses,  no organomegaly and incison well healing   Vulva:  not evaluated  Vagina: not evaluated  Cervix:     Corpus: not examined  Adnexa:  not evaluated  Rectal Exam: Not performed.        Assessment:      normal postpartum exam. Pap smear not done at today's visit.   Plan:    1. Contraception: tubal ligation 2. 2 hr GTT 3. Follow up in:as needed.    Adam PhenixJames G Arnold, MD 09/28/2014

## 2014-09-29 LAB — GLUCOSE TOLERANCE, 2 HOURS
GLUCOSE, FASTING: 77 mg/dL (ref 70–99)
Glucose, 2 hour: 94 mg/dL (ref 70–139)

## 2014-10-02 ENCOUNTER — Telehealth: Payer: Self-pay

## 2014-10-02 NOTE — Telephone Encounter (Signed)
Called patient with pacific interpreter [phone]#[id number]. Informed her of normal 2hr gtt. Pt. Verbalized understanding and gratitude. No questions or concerns.

## 2014-10-02 NOTE — Telephone Encounter (Signed)
-----   Message from Adam PhenixJames G Arnold, MD sent at 10/01/2014  5:14 PM EDT ----- Normal result

## 2016-11-05 IMAGING — US US FETAL BPP W/O NONSTRESS
1 series · 14 of 15 positions shown · non-contrast
Comparison: none

[Series 1: us fetal bpp w/o nonstress · 0.20mm/px · 15 acquisitions, 14 frames shown]
[im 1/15]
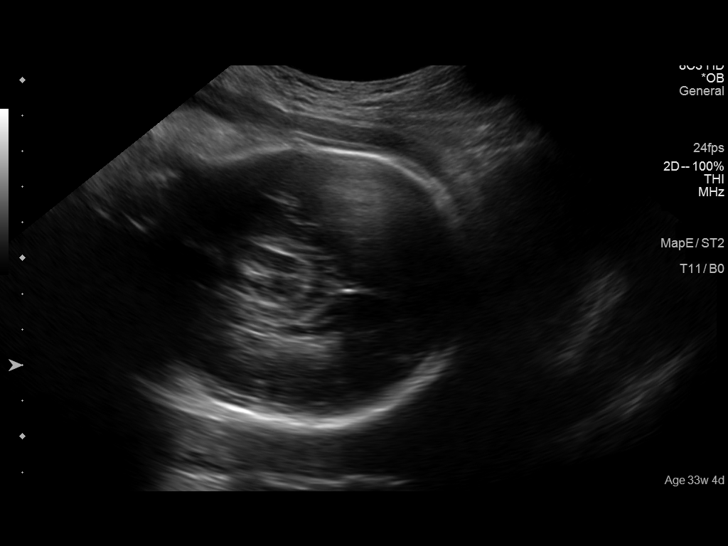
[im 2/15]
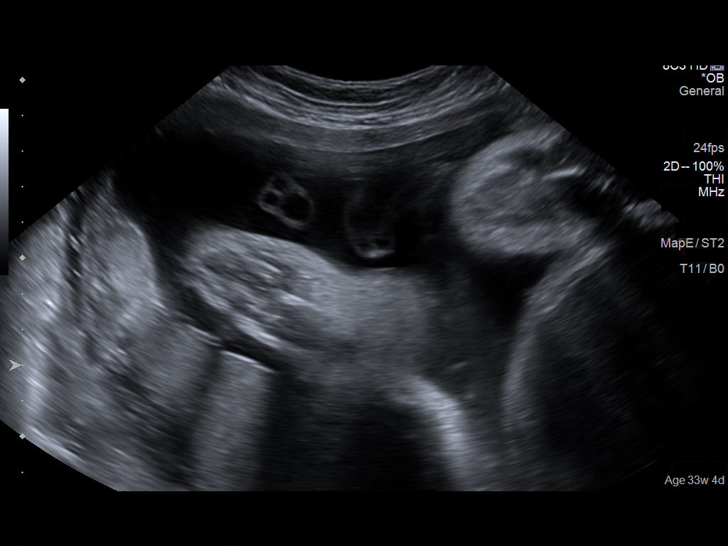
[im 3/15]
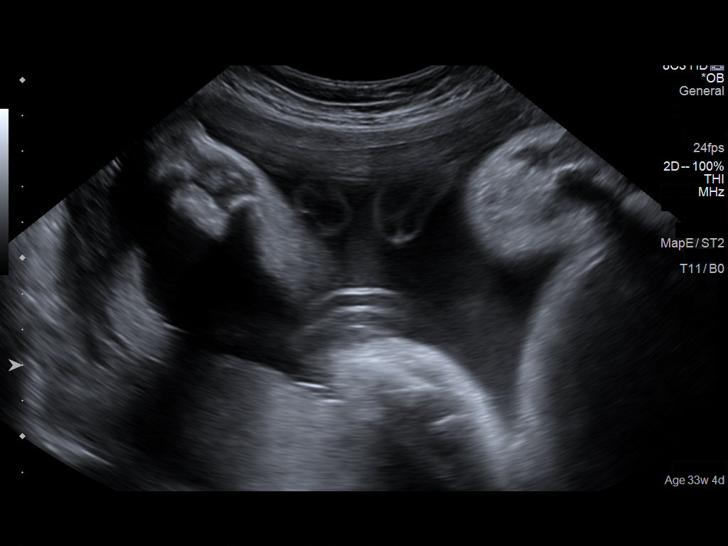
[im 4/15]
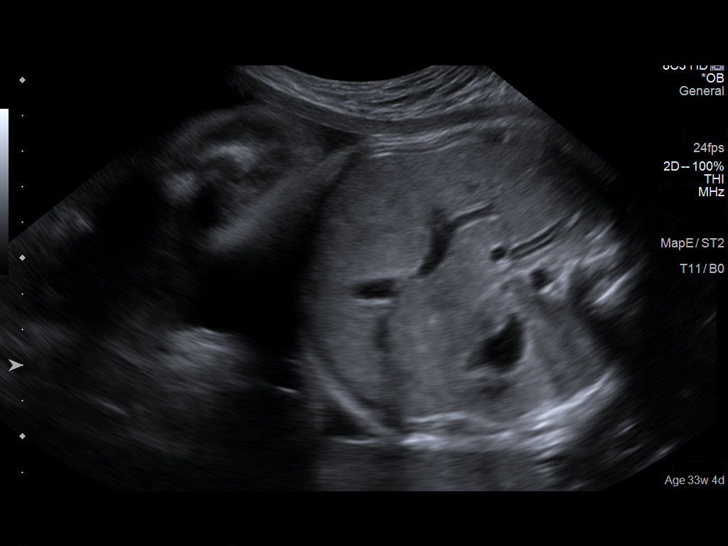
[im 5/15]
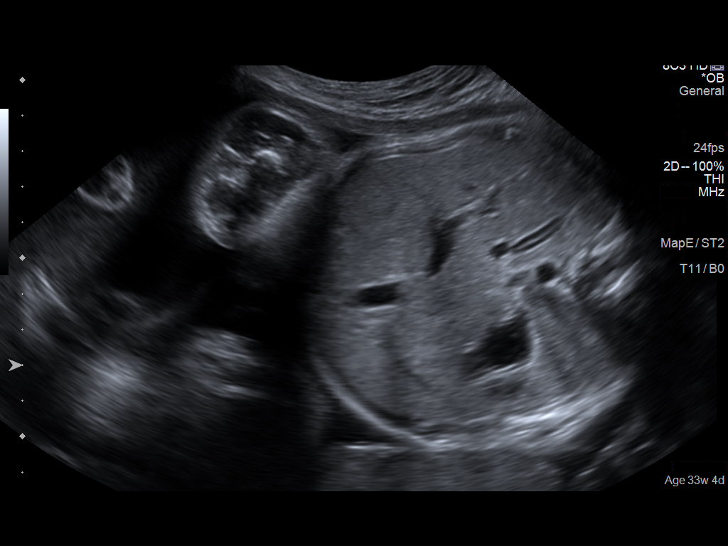
[im 6/15]
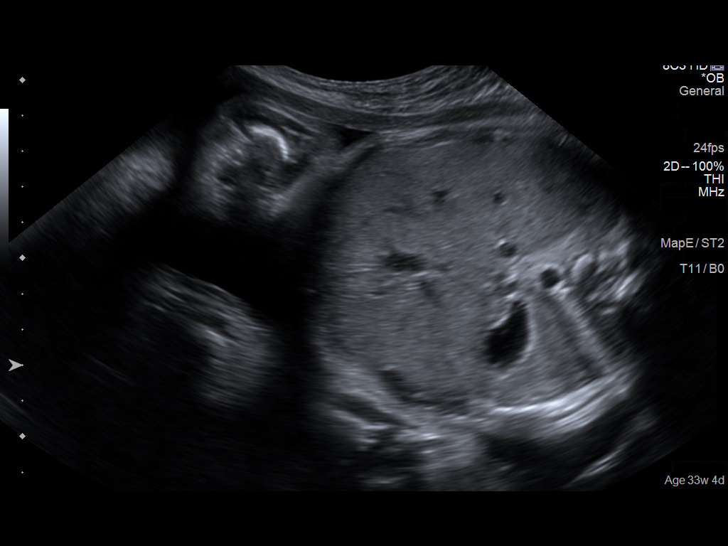
[im 7/15]
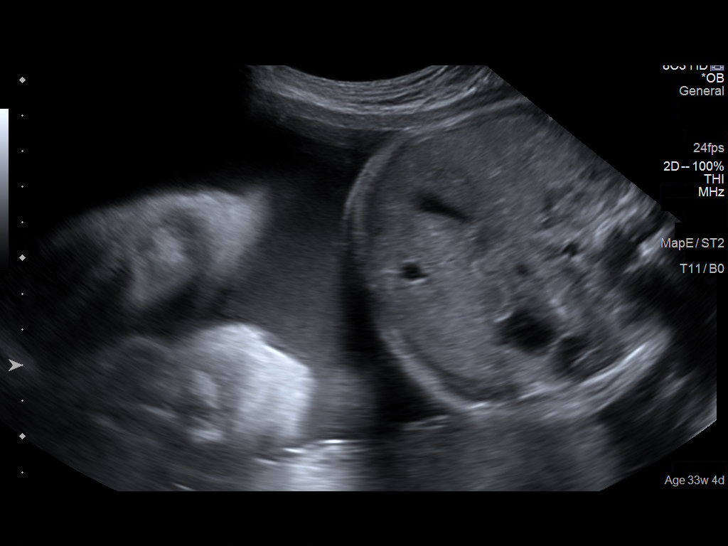
[im 9/15]
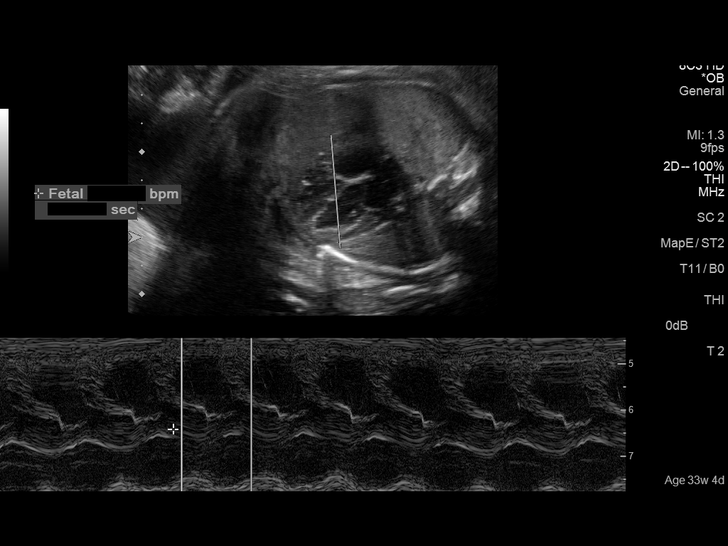
[im 10/15]
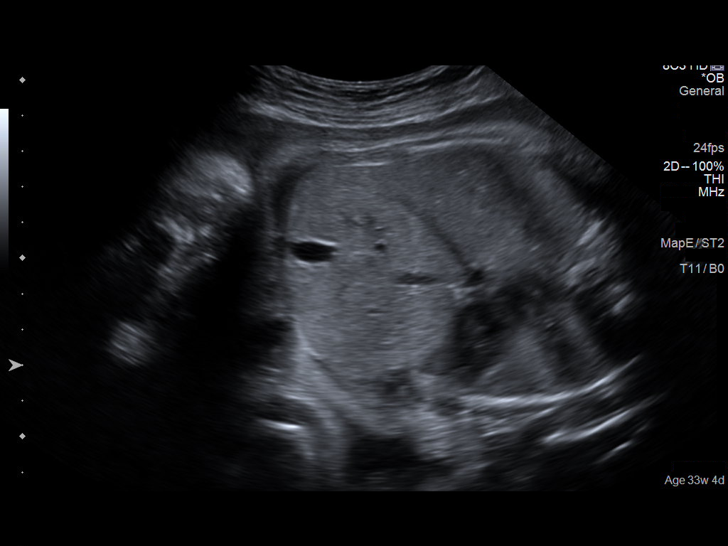
[im 11/15]
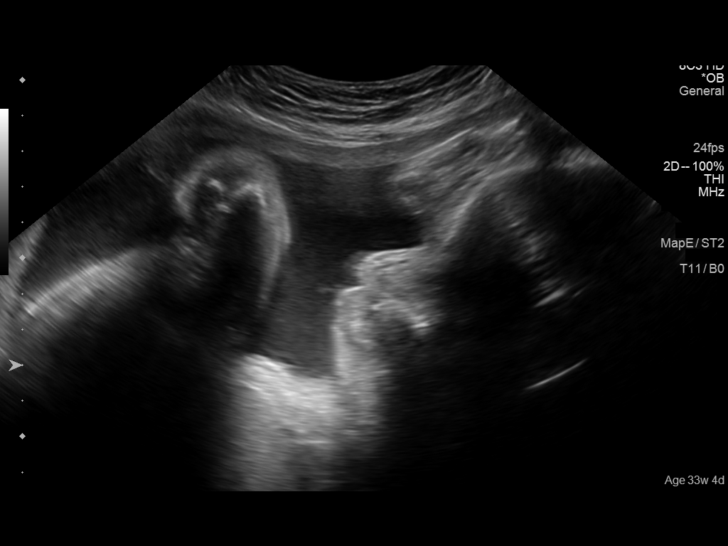
[im 12/15]
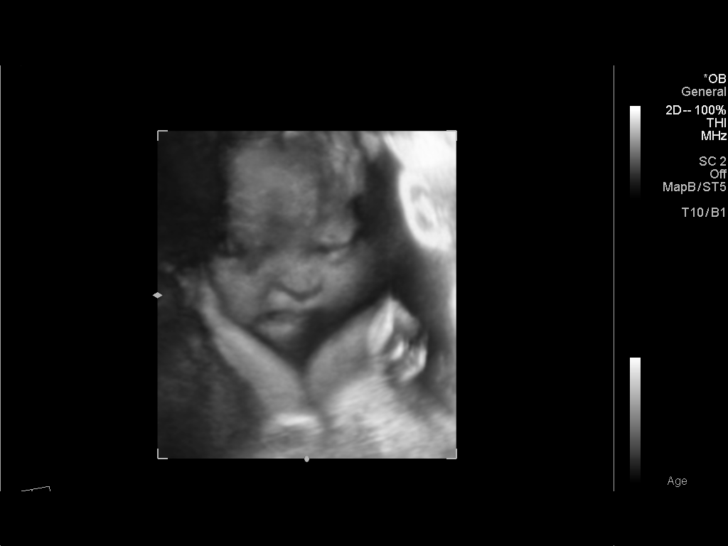
[im 13/15]
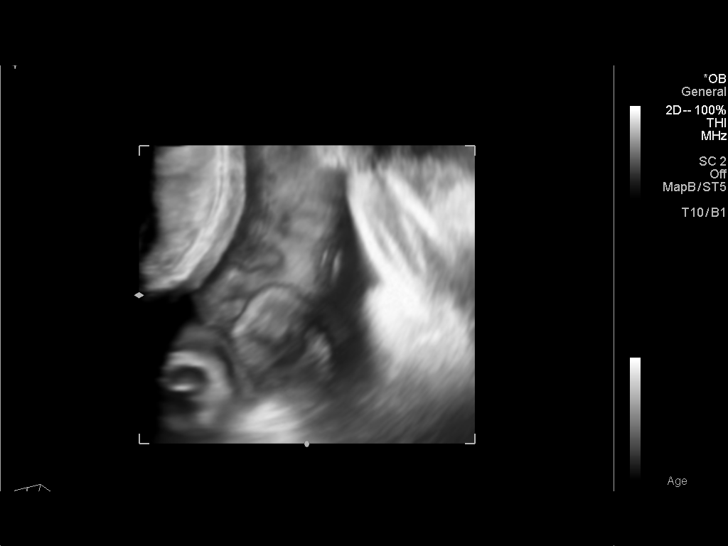
[im 14/15]
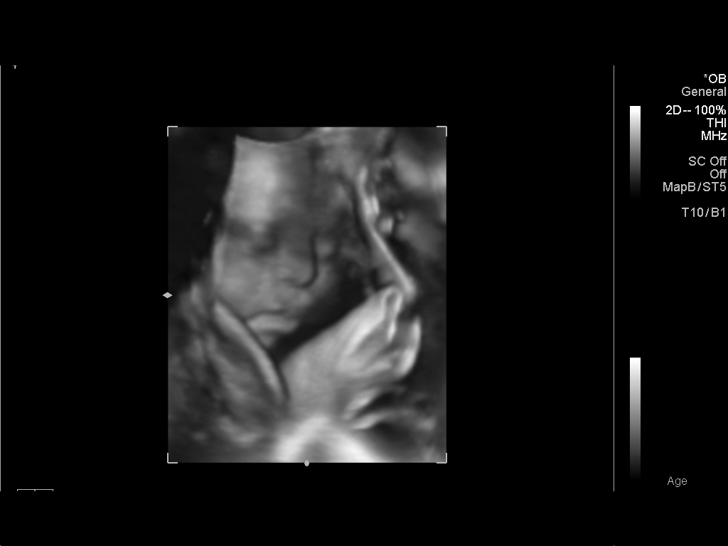
[im 15/15]
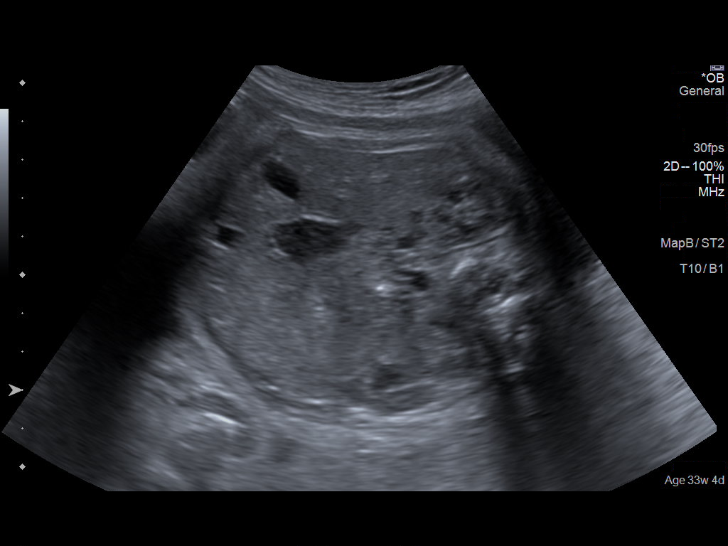

[14 of 15 positions shown; findings below may reference images not displayed]

OBSTETRICS REPORT
                      (Signed Final 07/17/2014 [DATE])

             MONAGHAN

Service(s) Provided

Indications

 33 weeks gestation of pregnancy
 Pre-existing diabetes, type 1, in pregnancy, second
 trimester
 Advanced maternal age multigravida 35+, second
 trimester
 Non-reactive NST
Fetal Evaluation

 Num Of Fetuses:    1
 Fetal Heart Rate:  147                          bpm
 Cardiac Activity:  Observed
 Presentation:      Cephalic

 Amniotic Fluid
 AFI FV:      Subjectively within normal limits
                                             Larg Pckt:    6.04  cm
Biophysical Evaluation

 Amniotic F.V:   Pocket => 2 cm two         F. Tone:        Observed
                 planes
 F. Movement:    Observed                   Score:          [DATE]
 F. Breathing:   Observed
Gestational Age

 LMP:           33w 4d        Date:  11/23/13                 EDD:   08/30/14
 Best:          33w 4d     Det. By:  LMP  (11/23/13)          EDD:   08/30/14
Impression

 Sinigle IUP at 33w 4d
 Preexisting diabetes
 Active fetus with BPP of [DATE]
 Normal amniotic fluid volume
Recommendations

 Continue antenatal testing as scheduled

## 2017-05-13 ENCOUNTER — Other Ambulatory Visit: Payer: Self-pay | Admitting: Licensed Clinical Social Worker

## 2020-11-21 ENCOUNTER — Other Ambulatory Visit (HOSPITAL_BASED_OUTPATIENT_CLINIC_OR_DEPARTMENT_OTHER): Payer: Self-pay | Admitting: Internal Medicine

## 2020-11-21 ENCOUNTER — Other Ambulatory Visit: Payer: Self-pay | Admitting: Internal Medicine
